# Patient Record
Sex: Male | Born: 1975 | Race: White | Hispanic: No | Smoking: Former smoker
Health system: Southern US, Community
[De-identification: ages and names within clinical notes are randomized; demographics above are authoritative.]

## PROBLEM LIST (undated history)

## (undated) DIAGNOSIS — I10 Essential (primary) hypertension: Secondary | ICD-10-CM

## (undated) DIAGNOSIS — N471 Phimosis: Secondary | ICD-10-CM

## (undated) DIAGNOSIS — H913 Deaf nonspeaking, not elsewhere classified: Secondary | ICD-10-CM

## (undated) DIAGNOSIS — I77810 Thoracic aortic ectasia: Secondary | ICD-10-CM

## (undated) DIAGNOSIS — K219 Gastro-esophageal reflux disease without esophagitis: Secondary | ICD-10-CM

## (undated) DIAGNOSIS — Z973 Presence of spectacles and contact lenses: Secondary | ICD-10-CM

## (undated) DIAGNOSIS — G4733 Obstructive sleep apnea (adult) (pediatric): Secondary | ICD-10-CM

## (undated) DIAGNOSIS — H9193 Unspecified hearing loss, bilateral: Secondary | ICD-10-CM

## (undated) DIAGNOSIS — E559 Vitamin D deficiency, unspecified: Secondary | ICD-10-CM

## (undated) HISTORY — PX: HAND CONTRACTURE RELEASE: SHX1724

---

## 2012-04-22 DIAGNOSIS — Z87828 Personal history of other (healed) physical injury and trauma: Secondary | ICD-10-CM

## 2012-04-22 HISTORY — PX: SKIN GRAFT FULL THICKNESS ARM: SUR1297

## 2012-04-22 HISTORY — DX: Personal history of other (healed) physical injury and trauma: Z87.828

## 2012-05-19 DIAGNOSIS — H913 Deaf nonspeaking, not elsewhere classified: Secondary | ICD-10-CM | POA: Insufficient documentation

## 2013-02-22 HISTORY — PX: CHOLECYSTECTOMY, LAPAROSCOPIC: SHX56

## 2016-02-23 HISTORY — PX: LIPOMA EXCISION: SHX5283

## 2016-12-02 DIAGNOSIS — E782 Mixed hyperlipidemia: Secondary | ICD-10-CM | POA: Insufficient documentation

## 2016-12-02 DIAGNOSIS — J449 Chronic obstructive pulmonary disease, unspecified: Secondary | ICD-10-CM | POA: Insufficient documentation

## 2016-12-02 DIAGNOSIS — F32A Depression, unspecified: Secondary | ICD-10-CM | POA: Insufficient documentation

## 2017-11-18 DIAGNOSIS — R112 Nausea with vomiting, unspecified: Secondary | ICD-10-CM | POA: Insufficient documentation

## 2018-01-04 DIAGNOSIS — R9430 Abnormal result of cardiovascular function study, unspecified: Secondary | ICD-10-CM | POA: Insufficient documentation

## 2018-01-04 DIAGNOSIS — G4730 Sleep apnea, unspecified: Secondary | ICD-10-CM | POA: Insufficient documentation

## 2018-01-04 DIAGNOSIS — I119 Hypertensive heart disease without heart failure: Secondary | ICD-10-CM | POA: Insufficient documentation

## 2018-01-04 DIAGNOSIS — R0683 Snoring: Secondary | ICD-10-CM | POA: Insufficient documentation

## 2018-01-04 DIAGNOSIS — R079 Chest pain, unspecified: Secondary | ICD-10-CM | POA: Insufficient documentation

## 2018-04-25 DIAGNOSIS — E66811 Obesity, class 1: Secondary | ICD-10-CM | POA: Insufficient documentation

## 2018-04-25 DIAGNOSIS — E6609 Other obesity due to excess calories: Secondary | ICD-10-CM | POA: Insufficient documentation

## 2019-07-18 ENCOUNTER — Emergency Department (HOSPITAL_COMMUNITY): Payer: No Typology Code available for payment source

## 2019-07-18 ENCOUNTER — Encounter (HOSPITAL_COMMUNITY): Payer: Self-pay | Admitting: Emergency Medicine

## 2019-07-18 ENCOUNTER — Other Ambulatory Visit: Payer: Self-pay

## 2019-07-18 ENCOUNTER — Emergency Department (HOSPITAL_COMMUNITY)
Admission: EM | Admit: 2019-07-18 | Discharge: 2019-07-19 | Disposition: A | Discharge status: Home / Self Care | Payer: No Typology Code available for payment source | Attending: Emergency Medicine | Admitting: Emergency Medicine

## 2019-07-18 DIAGNOSIS — F17228 Nicotine dependence, chewing tobacco, with other nicotine-induced disorders: Secondary | ICD-10-CM | POA: Diagnosis not present

## 2019-07-18 DIAGNOSIS — F17200 Nicotine dependence, unspecified, uncomplicated: Secondary | ICD-10-CM | POA: Diagnosis not present

## 2019-07-18 DIAGNOSIS — R0989 Other specified symptoms and signs involving the circulatory and respiratory systems: Secondary | ICD-10-CM | POA: Insufficient documentation

## 2019-07-18 DIAGNOSIS — H5789 Other specified disorders of eye and adnexa: Secondary | ICD-10-CM | POA: Diagnosis not present

## 2019-07-18 DIAGNOSIS — I10 Essential (primary) hypertension: Secondary | ICD-10-CM | POA: Diagnosis not present

## 2019-07-18 DIAGNOSIS — Z77098 Contact with and (suspected) exposure to other hazardous, chiefly nonmedicinal, chemicals: Secondary | ICD-10-CM

## 2019-07-18 HISTORY — DX: Essential (primary) hypertension: I10

## 2019-07-18 LAB — BASIC METABOLIC PANEL
Anion gap: 8 (ref 5–15)
BUN: 15 mg/dL (ref 6–20)
CO2: 24 mmol/L (ref 22–32)
Calcium: 9.1 mg/dL (ref 8.9–10.3)
Chloride: 109 mmol/L (ref 98–111)
Creatinine, Ser: 1.01 mg/dL (ref 0.61–1.24)
GFR calc Af Amer: >60 mL/min (ref >60)
GFR calc non Af Amer: >60 mL/min (ref >60)
Glucose, Bld: 89 mg/dL (ref 70–99)
Potassium: 3.9 mmol/L (ref 3.5–5.1)
Sodium: 141 mmol/L (ref 135–145)

## 2019-07-18 LAB — CBC
HCT: 44 % (ref 39.0–52.0)
Hemoglobin: 15.2 g/dL (ref 13.0–17.0)
MCH: 31.5 pg (ref 26.0–34.0)
MCHC: 34.5 g/dL (ref 30.0–36.0)
MCV: 91.3 fL (ref 80.0–100.0)
Platelets: 194 10*3/uL (ref 150–400)
RBC: 4.82 MIL/uL (ref 4.22–5.81)
RDW: 11.7 % (ref 11.5–15.5)
WBC: 6.7 10*3/uL (ref 4.0–10.5)
nRBC: 0 % (ref 0.0–0.2)

## 2019-07-18 NOTE — ED Triage Notes (Addendum)
Patient arrives to ED with complaints of being exposed to Peraside 15 at work which is peracetic acid and hydrogen peroxide. Patient states he had no contact with the chemical but was breathing the chemicals in for about 15 minutes. patient now complaints of his eyes burning, throat being sore, and chest burning. VSS. Patientes eyes washed with EMS and at work.

## 2019-07-18 NOTE — ED Notes (Signed)
Poison controlled called. Information on Peraside-15 obtained. Advised that irritation should not worsen over time. No need to flush with water at this time. Supplemental oxygen for comfort, but not necessary. Information relayed to [facility].

## 2019-07-19 NOTE — ED Provider Notes (Signed)
[facility] EMERGENCY DEPARTMENT Provider Note   CSN: [id number] Arrival date & time: 07/18/19  1840     History Chief Complaint  Patient presents with  . Chemical Exposure    Greg Velez is a 44 y.o. male with history significant for hypertension who presents for evaluation after chemical exposure.  Patient states he works at [employer].  Apparently someone had spilled Peraside-15.  On the floor.  Patient did not realize this.  He continued to work around the area for approximately 10 minutes.  Patient states he had itchy watery eyes, scratchy throat and burning sensation in his throat.  Was seen by EMS and had his eyes washed.  He denies getting any chemicals physically on him or in his eyes.  Patient with extended wait time greater than 8 hours in the waiting room.  On my initial evaluation he denies any complaints.  No headache, lightheadedness, dizziness, eye pain, vision changes, congestion, rhinorrhea, chest pain, shortness of breath, sensation of throat closing, cough, rashes or lesions.  Denies recent aggravating relieving factors.  History obtained from patient past medical records.  Sign language interpreter was used.  HPI     Past Medical History:  Diagnosis Date  . Hypertension     There are no problems to display for this patient.   History reviewed. No pertinent surgical history.     History reviewed. No pertinent family history.  Social History   Tobacco Use  . Smoking status: Current Every Day Smoker  . Smokeless tobacco: Current User  Substance Use Topics  . Alcohol use: Not on file  . Drug use: Not on file    Home Medications Prior to Admission medications   Not on File    Allergies    Patient has no known allergies.  Review of Systems   Review of Systems  Constitutional: Negative.   HENT: Negative for congestion, drooling, ear discharge, ear pain, facial swelling, postnasal drip, rhinorrhea, sinus pain, sore throat,  trouble swallowing and voice change.        Scratchy throat  Eyes: Positive for discharge.  Respiratory: Negative.   Cardiovascular: Negative.   Gastrointestinal: Negative.   Genitourinary: Negative.   Musculoskeletal: Negative.   Skin: Negative.   Neurological: Negative.   All other systems reviewed and are negative.   Physical Exam Updated Vital Signs BP 129/90   Pulse (!) 56   Temp 97.7 F (36.5 C) (Oral)   Resp 18   Ht 6\' 2"  (1.88 m)   Wt 127 kg   SpO2 100%   BMI 35.95 kg/m   Physical Exam Vitals and nursing note reviewed.  Constitutional:      General: He is not in acute distress.    Appearance: He is well-developed. He is not ill-appearing, toxic-appearing or diaphoretic.  HENT:     Head: Normocephalic and atraumatic.     Jaw: There is normal jaw occlusion.     Nose: Nose normal.     Comments: No rhinorrhea or congestion    Mouth/Throat:     Lips: Pink.     Mouth: Mucous membranes are moist.     Tongue: No lesions.     Palate: No mass and lesions.     Pharynx: Oropharynx is clear. Uvula midline.     Comments: Posterior oropharynx clear.  Mucous membranes moist.  No drooling, dysphagia or trismus.  No evidence of intraoral edema, lesions Eyes:     General: Lids are everted, no foreign bodies  appreciated. Vision grossly intact.     Extraocular Movements: Extraocular movements intact.     Conjunctiva/sclera: Conjunctivae normal.     Pupils: Pupils are equal, round, and reactive to light.     Visual Fields: Right eye visual fields normal and left eye visual fields normal.     Comments: Ph eyes bilateral WNL.  No fluorescein uptake on exam  Cardiovascular:     Rate and Rhythm: Normal rate and regular rhythm.     Pulses: Normal pulses.     Heart sounds: Normal heart sounds.  Pulmonary:     Effort: Pulmonary effort is normal. No respiratory distress.     Breath sounds: Normal breath sounds and air entry.  Abdominal:     General: There is no distension.      Palpations: Abdomen is soft.  Musculoskeletal:        General: Normal range of motion.     Cervical back: Normal range of motion and neck supple.  Skin:    General: Skin is warm and dry.     Capillary Refill: Capillary refill takes less than 2 seconds.     Comments: No rashes or lesions  Neurological:     General: No focal deficit present.     Mental Status: He is alert.     ED Results / Procedures / Treatments   Labs (all labs ordered are listed, but only abnormal results are displayed) Labs Reviewed  CBC  BASIC METABOLIC PANEL    EKG None  Radiology DG Chest 2 View  Result Date: 07/18/2019 CLINICAL DATA:  Chemical exposure. Exposed to peracetic acid and hydrogren peroxide for 15 minutes today. Burning sensation in chest. EXAM: CHEST - 2 VIEW COMPARISON:  None. FINDINGS: Lung volumes are low. The cardiomediastinal contours are normal for technique and inspiration. Patchy bibasilar opacities. Pulmonary vasculature is normal. No consolidation, pleural effusion, or pneumothorax. No acute osseous abnormalities are seen. IMPRESSION: Low lung volumes with patchy bibasilar opacities favoring atelectasis. Electronically Signed   By: Narda Rutherford M.D.   On: 07/18/2019 19:22    Procedures Procedures (including critical care time)  Medications Ordered in ED Medications - No data to display  ED Course  I have reviewed the triage vital signs and the nursing notes.  Pertinent labs & imaging results that were available during my care of the patient were reviewed by me and considered in my medical decision making (see chart for details).  44 year old male presents for evaluation of inhalation exposure while at work.  Patient with watery eyes, scratchy throat, burning sensation to throat.  Was seen by EMS at scene and had irrigation of his eyes.  Patient with extended wait time of greater than 8 hours in the waiting room.  On my initial evaluation patient denies any complaints.  His  heart and lungs are clear.  His abdomen is soft.  No evidence of rashes or lesions.  Eye exam without any fluorescein uptake, no ulcerations, pH eyes within normal limits.  No visual changes.  Posterior oropharynx clear.  No evidence of intraoral edema or lesions.  Poison control was contacted by nurses from triage.  Did not recommend any labs.  They stated patient did not need any flushing of his membranes her skin at that time.  Symptomatic management at home. Tolerating PO intake in ED without difficulty.  Labs and imaging were obtained from triage which I personally reviewed and interpreted.  No acute findings.  No evidence of pneumonitis or infectious process on chest  x-ray.  Patient likely with symptoms related to inhalation exposure.  Continues to be asymptomatic.  Discussed symptom control at home.  He can follow up outpatient with PCP for reevaluation or return here for any worsening symptoms.  The patient has been appropriately medically screened and/or stabilized in the ED. I have low suspicion for any other emergent medical condition which would require further screening, evaluation or treatment in the ED or require inpatient management.  Patient is hemodynamically stable and in no acute distress.  Patient able to ambulate in department prior to ED.  Evaluation does not show acute pathology that would require ongoing or additional emergent interventions while in the emergency department or further inpatient treatment.  I have discussed the diagnosis with the patient and answered all questions.  Pain is been managed while in the emergency department and patient has no further complaints prior to discharge.  Patient is comfortable with plan discussed in room and is stable for discharge at this time.  I have discussed strict return precautions for returning to the emergency department.  Patient was encouraged to follow-up with PCP/specialist refer to at discharge.   MDM Rules/Calculators/A&P                        Final Clinical Impression(s) / ED Diagnoses Final diagnoses:  None    Rx / DC Orders ED Discharge Orders    None       Rheanna Sergent A, PA-C 07/19/19 1443    Merryl Hacker, MD 07/19/19 [phone]

## 2019-07-19 NOTE — Discharge Instructions (Signed)
Use over-the-counter eyedrops for any irritation.  Make sure to drink plenty of fluids.  Return for any worsening symptoms.

## 2019-07-19 NOTE — ED Notes (Signed)
Pt given discharge instructions , no further questions at this time.

## 2020-03-11 ENCOUNTER — Emergency Department (HOSPITAL_COMMUNITY)
Admission: EM | Admit: 2020-03-11 | Discharge: 2020-03-11 | Disposition: A | Discharge status: Home / Self Care | Payer: BC Managed Care – PPO | Attending: Emergency Medicine | Admitting: Emergency Medicine

## 2020-03-11 ENCOUNTER — Emergency Department (HOSPITAL_COMMUNITY): Payer: BC Managed Care – PPO

## 2020-03-11 DIAGNOSIS — R079 Chest pain, unspecified: Secondary | ICD-10-CM

## 2020-03-11 DIAGNOSIS — R0602 Shortness of breath: Secondary | ICD-10-CM | POA: Insufficient documentation

## 2020-03-11 DIAGNOSIS — R0789 Other chest pain: Secondary | ICD-10-CM | POA: Diagnosis not present

## 2020-03-11 DIAGNOSIS — I10 Essential (primary) hypertension: Secondary | ICD-10-CM | POA: Insufficient documentation

## 2020-03-11 DIAGNOSIS — F17200 Nicotine dependence, unspecified, uncomplicated: Secondary | ICD-10-CM | POA: Insufficient documentation

## 2020-03-11 LAB — BASIC METABOLIC PANEL
Anion gap: 11 (ref 5–15)
BUN: 12 mg/dL (ref 6–20)
CO2: 21 mmol/L — ABNORMAL LOW (ref 22–32)
Calcium: 8.8 mg/dL — ABNORMAL LOW (ref 8.9–10.3)
Chloride: 106 mmol/L (ref 98–111)
Creatinine, Ser: 0.93 mg/dL (ref 0.61–1.24)
GFR, Estimated: >60 mL/min (ref >60)
Glucose, Bld: 82 mg/dL (ref 70–99)
Potassium: 4.1 mmol/L (ref 3.5–5.1)
Sodium: 138 mmol/L (ref 135–145)

## 2020-03-11 LAB — CBC
HCT: 44.9 % (ref 39.0–52.0)
Hemoglobin: 16.1 g/dL (ref 13.0–17.0)
MCH: 32.1 pg (ref 26.0–34.0)
MCHC: 35.9 g/dL (ref 30.0–36.0)
MCV: 89.4 fL (ref 80.0–100.0)
Platelets: 189 10*3/uL (ref 150–400)
RBC: 5.02 MIL/uL (ref 4.22–5.81)
RDW: 12 % (ref 11.5–15.5)
WBC: 7.1 10*3/uL (ref 4.0–10.5)
nRBC: 0 % (ref 0.0–0.2)

## 2020-03-11 LAB — BRAIN NATRIURETIC PEPTIDE: B Natriuretic Peptide: 7.4 pg/mL (ref 0.0–100.0)

## 2020-03-11 LAB — TROPONIN I (HIGH SENSITIVITY)
Troponin I (High Sensitivity): 2 ng/L (ref <18)
Troponin I (High Sensitivity): 3 ng/L (ref <18)

## 2020-03-11 MED ORDER — METHOCARBAMOL 500 MG PO TABS
500.0000 mg | ORAL_TABLET | Freq: Two times a day (BID) | ORAL | 0 refills | Status: DC
Start: 2020-03-11 — End: 2021-06-24

## 2020-03-11 MED ORDER — IBUPROFEN 600 MG PO TABS
600.0000 mg | ORAL_TABLET | Freq: Four times a day (QID) | ORAL | 0 refills | Status: DC | PRN
Start: 2020-03-11 — End: 2021-10-06

## 2020-03-11 MED ORDER — IOHEXOL 300 MG/ML  SOLN
75.0000 mL | Freq: Once | INTRAMUSCULAR | Status: AC | PRN
  Administered 2020-03-11: 75 mL via INTRAVENOUS

## 2020-03-11 NOTE — ED Triage Notes (Signed)
Pt is deaf 

## 2020-03-11 NOTE — Discharge Instructions (Signed)
Your work-up here in the ED today was reassuring.  Your history and physical exam is suggestive of nonspecific chest discomfort, likely musculoskeletal in nature.  I prescribed you ibuprofen 600 mg every 6 hours which I would like for you to take for inflammation and discomfort.  You were given a prescription for Robaxin which is a muscle relaxer.  You should not drive, work, consume alcohol, or operate machinery while taking this medication as it can make you very drowsy.    I also recommend activity modification, specifically no overhead arm movements or other activity that worsens your left-sided chest discomfort.  Suspect muscle strain.  I recommend light stretching exercises instead.  It is vitally important to get established with a primary care provider for ongoing evaluation and management.  I have also placed an ambulatory referral to cardiology given your risk factors and family history of premature cardiac disease.  Return to the ED or seek immediate medical attention should you experience any new or worsening symptoms.

## 2020-03-11 NOTE — ED Provider Notes (Signed)
[facility] EMERGENCY DEPARTMENT Provider Note   CSN: [id number] Arrival date & time: 03/11/20  1221     History No chief complaint on file.   Greg Velez is a 45 y.o. male with PMH of HTN, HLD, and OSA who presents the ED via EMS with complaints of chest pain.  On my examination, patient reports that at approximately 11:15 AM he was in the freezer reaching up high for a container with his right hand when he developed a sudden onset left-sided chest pain described as "stabbing".  He stopped what he was doing and went outside to the office, but then developed worsening left-sided chest pain as well as shortness of breath.  He took aspirin and nitro x3 without any relief in his chest discomfort.  He states that he is not actively experiencing chest pain unless he moves in certain ways.  For example, if he extends his left arm or leans forward in the bed he will develop recurrence of his left-sided chest pain described as "sharp".  He had been seen by a cardiologist previously, but has not yet established care here in the [place] area.  He does not yet have a primary care provider.  He is concerned because his father and brother have each had multiple heart attacks.  He endorses continued daily tobacco use.  He denies any recent illness or infection, fevers or chills, abdominal pain, nausea or vomiting, hemoptysis, history of clots or clotting disorder, orthopnea, leg swelling, or other symptoms.  HPI     Past Medical History:  Diagnosis Date  . Hypertension     There are no problems to display for this patient.   No past surgical history on file.     No family history on file.  Social History   Tobacco Use  . Smoking status: Current Every Day Smoker  . Smokeless tobacco: Current User    Home Medications Prior to Admission medications   Medication Sig Start Date End Date Taking? Authorizing Provider  ibuprofen (ADVIL) 600 MG tablet Take 1 tablet (600 mg  total) by mouth every 6 (six) hours as needed. 03/11/20  Yes Lorelee New, PA-C  methocarbamol (ROBAXIN) 500 MG tablet Take 1 tablet (500 mg total) by mouth 2 (two) times daily. 03/11/20  Yes Lorelee New, PA-C    Allergies    Patient has no known allergies.  Review of Systems   Review of Systems  All other systems reviewed and are negative.   Physical Exam Updated Vital Signs BP 131/90   Pulse (!) 59   Temp (!) 97.5 F (36.4 C) (Oral)   Resp 15   SpO2 99%   Physical Exam Vitals and nursing note reviewed. Exam conducted with a chaperone present.  Constitutional:      General: He is not in acute distress.    Appearance: Normal appearance. He is not ill-appearing.  HENT:     Head: Normocephalic and atraumatic.  Eyes:     General: No scleral icterus.    Conjunctiva/sclera: Conjunctivae normal.  Cardiovascular:     Rate and Rhythm: Normal rate and regular rhythm.     Pulses: Normal pulses.     Heart sounds: Normal heart sounds.  Pulmonary:     Effort: Pulmonary effort is normal. No respiratory distress.     Breath sounds: Normal breath sounds. No wheezing or rales.     Comments: CTA bilaterally.  No increased work of breathing.  No tachypnea.  99% on room  air.  No reproducible chest wall tenderness. Abdominal:     General: Abdomen is flat. There is no distension.     Palpations: Abdomen is soft.     Tenderness: There is no abdominal tenderness. There is no guarding.  Musculoskeletal:     Cervical back: Normal range of motion.     Right lower leg: No edema.     Left lower leg: No edema.     Comments: Reproducible left-sided chest pain described as pinpoint underneath left breast with left arm abduction and overhead reaching.    Skin:    General: Skin is dry.     Capillary Refill: Capillary refill takes less than 2 seconds.  Neurological:     Mental Status: He is alert.     GCS: GCS eye subscore is 4. GCS verbal subscore is 5. GCS motor subscore is 6.   Psychiatric:        Mood and Affect: Mood normal.        Behavior: Behavior normal.        Thought Content: Thought content normal.     ED Results / Procedures / Treatments   Labs (all labs ordered are listed, but only abnormal results are displayed) Labs Reviewed  BASIC METABOLIC PANEL - Abnormal; Notable for the following components:      Result Value   CO2 21 (*)    Calcium 8.8 (*)    All other components within normal limits  CBC  BRAIN NATRIURETIC PEPTIDE  TROPONIN I (HIGH SENSITIVITY)  TROPONIN I (HIGH SENSITIVITY)    EKG EKG Interpretation  Date/Time:  Tuesday March 11 2020 12:25:35 EST Ventricular Rate:  91 PR Interval:  136 QRS Duration: 84 QT Interval:  348 QTC Calculation: 428 R Axis:   143 Text Interpretation: Normal sinus rhythm Left posterior fascicular block Abnormal ECG No old tracing to compare Confirmed by Mancel BaleWentz, Elliott [phone]([id number]) on 03/11/2020 7:44:13 PM   Radiology DG Chest 2 View  Result Date: 03/11/2020 CLINICAL DATA:  Chest pain EXAM: CHEST - 2 VIEW COMPARISON:  07/18/2019 FINDINGS: The heart size and mediastinal contours are within normal limits. Mild, diffuse bilateral interstitial opacity, likely with some left basilar atelectasis. There is a rounded opacity of the suprahilar left lung, which appears somewhat increased compared to prior examination dated 07/18/2019 although projects below the left first rib end. The visualized skeletal structures are unremarkable. IMPRESSION: 1. Mild, diffuse bilateral interstitial opacity, likely with some left basilar atelectasis. Findings are most consistent with pulmonary edema. 2. There is a rounded opacity of the suprahilar left lung, which appears somewhat increased compared to prior examination dated 07/18/2019 although projects below the left first rib end. This is concerning for a pulmonary mass or nodule. Recommend CT to further evaluate, not necessarily on an emergent basis. Electronically Signed   By: Lauralyn PrimesAlex   Bibbey M.D.   On: 03/11/2020 13:08   CT Chest W Contrast  Result Date: 03/11/2020 CLINICAL DATA:  Abnormal chest x-ray chest pain EXAM: CT CHEST WITH CONTRAST TECHNIQUE: Multidetector CT imaging of the chest was performed during intravenous contrast administration. CONTRAST:  75mL OMNIPAQUE IOHEXOL 300 MG/ML  SOLN COMPARISON:  Chest x-ray 03/11/2020 FINDINGS: Cardiovascular: Nonaneurysmal aorta. Normal heart size. No pericardial effusion Mediastinum/Nodes: No enlarged mediastinal, hilar, or axillary lymph nodes. Thyroid gland, trachea, and esophagus demonstrate no significant findings. Lungs/Pleura: Lungs are clear. No pleural effusion or pneumothorax. Upper Abdomen: No acute abnormality. Musculoskeletal: No chest wall abnormality. No acute or significant osseous findings. IMPRESSION: Negative. No CT  evidence for acute intrathoracic abnormality. Negative for pulmonary nodule. Findings on recent chest radiography felt secondary to prominent left first rib articulation with the sternum. Electronically Signed   By: Jasmine Pang M.D.   On: 03/11/2020 21:54    Procedures Procedures (including critical care time)  Medications Ordered in ED Medications  iohexol (OMNIPAQUE) 300 MG/ML solution 75 mL (75 mLs Intravenous Contrast Given 03/11/20 2147)    ED Course  I have reviewed the triage vital signs and the nursing notes.  Pertinent labs & imaging results that were available during my care of the patient were reviewed by me and considered in my medical decision making (see chart for details).    MDM Rules/Calculators/A&P                          Maleak Duda was evaluated in Emergency Department on 03/11/2020 for the symptoms described in the history of present illness. He was evaluated in the context of the global COVID-19 pandemic, which necessitated consideration that the patient might be at risk for infection with the SARS-CoV-2 virus that causes COVID-19. Institutional protocols and algorithms  that pertain to the evaluation of patients at risk for COVID-19 are in a state of rapid change based on information released by regulatory bodies including the CDC and federal and state organizations. These policies and algorithms were followed during the patient's care in the ED.  I personally reviewed patient's medical chart and all notes from triage and staff during today's encounter. I have also ordered and reviewed all labs and imaging that I felt to be medically necessary in the evaluation of this patient's complaints and with consideration of their with their physical exam. If needed, translation services were available and utilized.   Patient's history and physical exam is atypical for ACS and more suggestive of musculoskeletal injury.  Plain films of chest are reviewed and concerning for pulmonary edema.  There is also a rounded opacity which appears somewhat increased when compared to prior imaging.  Concern for possible pulmonary mass or nodule and recommending CT for further delineation.  Given that patient does not have any outpatient follow-up at this time, will obtain CT while second troponin is pending.  CT is personally reviewed entirely negative.  No evidence of pulmonary nodule, pneumonia, or other acute cardiopulmonary disease.  On subsequent evaluation, patient still continues to deny any chest pain.  His left-sided chest pain was once again aggravated while getting his CT because he was asked to move his arm above his head.  There is a clear reproducible cause for his symptoms.  I discussed with patient that if his second troponin is negative for delta troponin, we will discharge him home with ibuprofen 600 mg every 6 hours, Robaxin, and topical Voltaren gel.  Encouraged light stretching exercises.  We will also place ambulatory referral to cardiology.  His laboratory work-up is reassuring thus far, but second troponin is still pending.  If no positive delta troponin, patient is  reasonable for discharge and outpatient follow-up.   Final Clinical Impression(s) / ED Diagnoses Final diagnoses:  Nonspecific chest pain    Rx / DC Orders ED Discharge Orders         Ordered    Ambulatory referral to Cardiology        03/11/20 2229    methocarbamol (ROBAXIN) 500 MG tablet  2 times daily        03/11/20 2230    ibuprofen (ADVIL) 600  MG tablet  Every 6 hours PRN        03/11/20 2230           Elvera Maria 03/12/20 1956    Mancel Bale, MD 03/13/20 [phone]

## 2020-03-11 NOTE — ED Triage Notes (Signed)
Pt here from work with c/o chest pain , pt received 3 nitro and 324mg  asa no change in pain , no n/v or sob

## 2020-03-25 NOTE — Progress Notes (Signed)
Cardiology Office Note:    Date:  03/27/2020   ID:  Greg Velez, DOB Jun 10, 1975, MRN [id number]  PCP:  Patient, No Pcp Per  [facility] Cardiologist:  Meriam Sprague, MD  [facility] Electrophysiologist:  None   Referring MD: Lorelee New, PA-C    History of Present Illness:    Greg Velez is a 45 y.o. male with a hx of deafness, HTN, HLD and OSA who was referred by Evelena Leyden, PA-C for further evaluation of chest pain.  Patient in [facility] ED on 03/11/20 with sudden onset severe left sided stabbing chest pain after he was reaching for a container in the freezer. He stopped what he was doing but the pain persisted and he took ASA and nitro without relief. Went to ER where vitals stable, trop negative x2, ECG without ischemic changes. CT chest without acute pathology. Pain deemed to likely be MSK in nature and patient was discharged home.  The patient states that he has occasional heart pounding and he starts to feel weak and he sits down and the sensation passes. No DOE, LE edema, orthopnea, lightheadedness, dizziness or fainting. No bleeding issues. He could walk a mile without needing to stop. He admits to eating fast food often and continues to smoke.  Patient was previously seen by Cardiology at [facility]. Records reviewed:  "Echocardiogram January 16, 2018 showed normal left ventricular size with systolic function in the low normal range. There is mildly increased left ventricular wall thickness and mildly dilated left atrium with grade 1 diastolic dysfunction. Right ventricular size and function was felt to be normal. The ascending aorta was mildly dilated.  Pharmacologic stress testing with nuclear imaging was obtained January 16, 2018. The result was normalwith no evidence of ischemia. Ejection fraction was calculated at 65%.Additionally, his C-reactive protein was normal."   Family history notable for MI in his father, uncle.  History obtained with  assistance of ASL interpreter.  Past Medical History:  Diagnosis Date  . Hypertension     History reviewed. No pertinent surgical history.  Current Medications: Current Meds  Medication Sig  . atorvastatin (LIPITOR) 10 MG tablet Take by mouth.  . benzonatate (TESSALON) 200 MG capsule Take by mouth.  . carvedilol (COREG) 6.25 MG tablet Take 1 tablet (6.25 mg total) by mouth 2 (two) times daily.  . cyclobenzaprine (FLEXERIL) 10 MG tablet Take by mouth.  Marland Kitchen ibuprofen (ADVIL) 600 MG tablet Take 1 tablet (600 mg total) by mouth every 6 (six) hours as needed.  Marland Kitchen losartan (COZAAR) 100 MG tablet Take 1 tablet by mouth daily.  . methocarbamol (ROBAXIN) 500 MG tablet Take 1 tablet (500 mg total) by mouth 2 (two) times daily.  Marland Kitchen omeprazole (PRILOSEC) 40 MG capsule   . [DISCONTINUED] lisinopril (ZESTRIL) 10 MG tablet Take by mouth.     Allergies:   Patient has no known allergies.   Social History   Socioeconomic History  . Marital status: Married    Spouse name: Not on file  . Number of children: Not on file  . Years of education: Not on file  . Highest education level: Not on file  Occupational History  . Not on file  Tobacco Use  . Smoking status: Current Every Day Smoker  . Smokeless tobacco: Current User  Substance and Sexual Activity  . Alcohol use: Not on file  . Drug use: Not on file  . Sexual activity: Not on file  Other Topics Concern  . Not on file  Social History Narrative  . Not on file   Social Determinants of Health   Financial Resource Strain: Not on file  Food Insecurity: Not on file  Transportation Needs: Not on file  Physical Activity: Not on file  Stress: Not on file  Social Connections: Not on file     Family History: The patient's family history is not on file.  ROS:   Please see the history of present illness.    Review of Systems  Constitutional: Negative for chills and fever.  HENT: Negative for nosebleeds.   Eyes: Negative for blurred  vision.  Respiratory: Negative for shortness of breath.   Cardiovascular: Positive for chest pain. Negative for palpitations, orthopnea, claudication, leg swelling and PND.  Gastrointestinal: Negative for nausea and vomiting.  Genitourinary: Negative for hematuria.  Musculoskeletal: Positive for joint pain. Negative for falls.  Neurological: Negative for dizziness and loss of consciousness.  Endo/Heme/Allergies: Negative for polydipsia.  Psychiatric/Behavioral: Negative for substance abuse.    EKGs/Labs/Other Studies Reviewed:    The following studies were reviewed today:  2019: Left Ventricle  Left ventricle size is normal.  The left ventricular systolic function is low normal. EF is visually  estimated at 50-55%  No regional wall motion abnormalities were noted.  No evidence of left ventricular mass or thrombus noted.  No evidence of ventricular septal defect.  Mildly increased left ventricle wall thickness.  Grade I Diastolic Dysfunction (Delayed relaxation).   Left Atrium  Mildly dilated left atrium.  No evidence of intracardiac shunting by color flow Doppler.  No evidence of atrial septal defect.   Right Atrium  Right atrial size is normal.   Right Ventricle  Normal right ventricular size and function.  A moderator band is seen in the right ventricle.   Mitral Valve  The mitral valve leaflets are mildly thickened.  No evidence of mitral valve stenosis.  Trace mitral regurgitation is present.   Tricuspid Valve  Tricuspid valve was not well visualized  There is trivial tricuspid regurgitation.  The right ventricular systolic pressure is within normal limits at 25  mmHg.   Aortic Valve  The aortic valve istrileaflet.  Aortic valve leaflets are somewhat thickened.  No hemodynamically significant valvular aortic stenosis.   Pulmonic Valve  The pulmonic valve was not well visualized.  Trace pulmonic regurgitation present.    Miscellaneous  There is mild dilation of the aortic root.  There is mild dilation of the ascending aorta.  No obvious dissection could be visualized.  The Pulmonary artery is within normal limits.   Pharmacologic stress testing with nuclear imaging was obtained January 16, 2018. The result was normalwith no evidence of ischemia. Ejection fraction was calculated at 65%.  EKG:  EKG 03/12/20: NSR with left posterior block  Recent Labs: 03/11/2020: B Natriuretic Peptide 7.4; BUN 12; Creatinine, Ser 0.93; Hemoglobin 16.1; Platelets 189; Potassium 4.1; Sodium 138  Recent Lipid Panel No results found for: CHOL, TRIG, HDL, CHOLHDL, VLDL, LDLCALC, LDLDIRECT    Physical Exam:    VS:  BP (!) 150/112 Comment: right arm 140/110  Pulse 75   Ht 6\' 2"  (1.88 m)   Wt (!) 313 lb (142 kg)   SpO2 95%   BMI 40.19 kg/m     Wt Readings from Last 3 Encounters:  03/27/20 (!) 313 lb (142 kg)  07/18/19 280 lb (127 kg)     GEN:  Well nourished, well developed in no acute distress. Deaf. HEENT: Normal NECK: No JVD; No carotid bruits CARDIAC:  RRR, no murmurs, rubs, gallops RESPIRATORY:  Clear to auscultation without rales, wheezing or rhonchi  ABDOMEN: Soft, non-tender, non-distended MUSCULOSKELETAL:  No edema; No deformity  SKIN: Warm and dry NEUROLOGIC:  Alert and oriented x 3 PSYCHIATRIC:  Normal affect   ASSESSMENT:    1. Atypical chest pain   2. Aortic dilatation (HCC)   3. Hypertension, unspecified type   4. Mixed hyperlipidemia   5. Tobacco abuse   6. Morbid obesity (HCC)    PLAN:    In order of problems listed above:  #Atypical Chest Pain: Likely MSK in nature as symptoms developed after reaching for something high in the fridge. Work-up including ECG, trops, CT chest reassuringly normal. Myoview 2019 norma. TTE in 2019 with EF 50-55% but no WMA.  -Continue to monitor  #HTN: Poorly controlled.  -Stop lisinopril and continue losartan 100mg  daily -Start coreg  6.25mg  BID given dilated aorta -Extensive counseling about Low Na diet and avoiding fast foods as likely a major contributor to his hypertesnion -Follow-up in HTN clinic  #Dilated ascending aorta: Noted on OSH TTE. No repeat in our system. -Repeat TTE -Blood pressure control as above  #HLD: -Continue atorvastatin 10mg  -Check lipids next week  #Tobacco Abuse: Extensive time spent talking to the patient about tobacco cessation as this will significantly decrease his risk of future CV events. He is amenable to try. -Continue to encourage tobacco cessation  #Morbid Obesity:  BMI 40.  -Discussed diet and exercise at length as detailed below  Exercise recommendations: Goal of exercising for at least 30 minutes a day, at least 5 times per week.  Please exercise to a moderate exertion.  This means that while exercising it is difficult to speak in full sentences, however you are not so short of breath that you feel you must stop, and not so comfortable that you can carry on a full conversation.  Exertion level should be approximately a 5/10, if 10 is the most exertion you can perform.  Diet recommendations: Recommend a heart healthy diet such as the Mediterranean diet.  This diet consists of plant based foods, healthy fats, lean meats, olive oil.  It suggests limiting the intake of simple carbohydrates such as white breads, pastries, and pastas.  It also limits the amount of red meat, wine, and dairy products such as cheese that one should consume on a daily basis.   Medication Adjustments/Labs and Tests Ordered: Current medicines are reviewed at length with the patient today.  Concerns regarding medicines are outlined above.  Orders Placed This Encounter  Procedures  . Lipid Profile  . Basic Metabolic Panel (BMET)  . AMB Referral to [facility] Pharm[facility]  . ECHOCARDIOGRAM COMPLETE   Meds ordered this encounter  Medications  . carvedilol (COREG) 6.25 MG tablet    Sig: Take 1 tablet (6.25  mg total) by mouth 2 (two) times daily.    Dispense:  180 tablet    Refill:  1    Patient Instructions   Medication Instructions:  Your physician has recommended you make the following change in your medication: Stop lisinopril. Start Carvedilol 6.25 mg by mouth twice daily  *If you need a refill on your cardiac medications before your next appointment, please call your pharmacy*   Lab Work: Your physician recommends that you return for lab work next week on day of appointment in hypertension clinic.  This will be fasting lab work--BMP and lipid profile  If you have labs (blood work) drawn today and your tests are completely  normal, you will receive your results only by: Marland Kitchen. MyChart Message (if you have MyChart) OR . A paper copy in the mail If you have any lab test that is abnormal or we need to change your treatment, we will call you to review the results.   Testing/Procedures: Your physician has requested that you have an echocardiogram. Echocardiography is a painless test that uses sound waves to create images of your heart. It provides your doctor with information about the size and shape of your heart and how well your heart's chambers and valves are working. This procedure takes approximately one hour. There are no restrictions for this procedure.     Follow-Up: [facility], you and your health needs are our priority.  As part of our continuing mission to provide you with exceptional heart care, we have created designated Provider Care Teams.  These Care Teams include your primary Cardiologist (physician) and Advanced Practice Providers (APPs -  Physician Assistants and Nurse Practitioners) who all work together to provide you with the care you need, when you need it.  We recommend signing up for the patient portal called "MyChart".  Sign up information is provided on this After Visit Summary.  MyChart is used to connect with patients for Virtual Visits (Telemedicine).   Patients are able to view lab/test results, encounter notes, upcoming appointments, etc.  Non-urgent messages can be sent to your provider as well.   To learn more about what you can do with MyChart, go to [email or web address].    Your next appointment:   3 month(s)  The format for your next appointment:   In Person  Provider:   You may see Meriam SpragueHeather E Lucky Trotta, MD or one of the following Advanced Practice Providers on your designated Care Team:    Tereso NewcomerScott Weaver, PA-C  Vin Monmouth BeachBhagat, [place] JerseyPA-C    Other Instructions You have been referred to hypertension clinic in our office.  Please schedule appointment for next week.     Steps to Quit Smoking Smoking tobacco is the leading cause of preventable death. It can affect almost every organ in the body. Smoking puts you and those around you at risk for developing many serious chronic diseases. Quitting smoking can be difficult, but it is one of the best things that you can do for your health. It is never too late to quit. How do I get ready to quit? When you decide to quit smoking, create a plan to help you succeed. Before you quit:  Pick a date to quit. Set a date within the next 2 weeks to give you time to prepare.  Write down the reasons why you are quitting. Keep this list in places where you will see it often.  Tell your family, friends, and co-workers that you are quitting. Support from your loved ones can make quitting easier.  Talk with your health care provider about your options for quitting smoking.  Find out what treatment options are covered by your health insurance.  Identify people, places, things, and activities that make you want to smoke (triggers). Avoid them. What first steps can I take to quit smoking?  Throw away all cigarettes at home, at work, and in your car.  Throw away smoking accessories, such as Set designerashtrays and lighters.  Clean your car. Make sure to empty the ashtray.  Clean your home, including curtains  and carpets. What strategies can I use to quit smoking? Talk with your health care provider about combining strategies, such as taking medicines  while you are also receiving in-person counseling. Using these two strategies together makes you more likely to succeed in quitting than if you used either strategy on its own.  If you are pregnant or breastfeeding, talk with your health care provider about finding counseling or other support strategies to quit smoking. Do not take medicine to help you quit smoking unless your health care provider tells you to do so. To quit smoking: Quit right away  Quit smoking completely, instead of gradually reducing how much you smoke over a period of time. Research shows that stopping smoking right away is more successful than gradually quitting.  Attend in-person counseling to help you build problem-solving skills. You are more likely to succeed in quitting if you attend counseling sessions regularly. Even short sessions of 10 minutes can be effective. Take medicine You may take medicines to help you quit smoking. Some medicines require a prescription and some you can purchase over-the-counter. Medicines may have nicotine in them to replace the nicotine in cigarettes. Medicines may:  Help to stop cravings.  Help to relieve withdrawal symptoms. Your health care provider may recommend:  Nicotine patches, gum, or lozenges.  Nicotine inhalers or sprays.  Non-nicotine medicine that is taken by mouth. Find resources Find resources and support systems that can help you to quit smoking and remain smoke-free after you quit. These resources are most helpful when you use them often. They include:  Online chats with a Veterinary surgeon.  Telephone quitlines.  Printed Materials engineer.  Support groups or group counseling.  Text messaging programs.  Mobile phone apps or applications. Use apps that can help you stick to your quit plan by providing reminders, tips,  and encouragement. There are many free apps for mobile devices as well as websites. Examples include Quit Guide from the [employer] and smokefree.gov   What things can I do to make it easier to quit?  Reach out to your family and friends for support and encouragement. Call telephone quitlines (1-800-QUIT-NOW), reach out to support groups, or work with a counselor for support.  Ask people who smoke to avoid smoking around you.  Avoid places that trigger you to smoke, such as bars, parties, or smoke-break areas at work.  Spend time with people who do not smoke.  Lessen the stress in your life. Stress can be a smoking trigger for some people. To lessen stress, try: ? Exercising regularly. ? Doing deep-breathing exercises. ? Doing yoga. ? Meditating. ? Performing a body scan. This involves closing your eyes, scanning your body from head to toe, and noticing which parts of your body are particularly tense. Try to relax the muscles in those areas.   How will I feel when I quit smoking? Day 1 to 3 weeks Within the first 24 hours of quitting smoking, you may start to feel withdrawal symptoms. These symptoms are usually most noticeable 2-3 days after quitting, but they usually do not last for more than 2-3 weeks. You may experience these symptoms:  Mood swings.  Restlessness, anxiety, or irritability.  Trouble concentrating.  Dizziness.  Strong cravings for sugary foods and nicotine.  Mild weight gain.  Constipation.  Nausea.  Coughing or a sore throat.  Changes in how the medicines that you take for unrelated issues work in your body.  Depression.  Trouble sleeping (insomnia). Week 3 and afterward After the first 2-3 weeks of quitting, you may start to notice more positive results, such as:  Improved sense of smell and taste.  Decreased coughing  and sore throat.  Slower heart rate.  Lower blood pressure.  Clearer skin.  The ability to breathe more easily.  Fewer sick  days. Quitting smoking can be very challenging. Do not get discouraged if you are not successful the first time. Some people need to make many attempts to quit before they achieve long-term success. Do your best to stick to your quit plan, and talk with your health care provider if you have any questions or concerns. Summary  Smoking tobacco is the leading cause of preventable death. Quitting smoking is one of the best things that you can do for your health.  When you decide to quit smoking, create a plan to help you succeed.  Quit smoking right away, not slowly over a period of time.  When you start quitting, seek help from your health care provider, family, or friends. This information is not intended to replace advice given to you by your health care provider. Make sure you discuss any questions you have with your health care provider. Document Revised: 11/03/2018 Document Reviewed: 04/29/2018 Elsevier Patient Education  2021 Elsevier Inc.    Mediterranean Diet A Mediterranean diet refers to food and lifestyle choices that are based on the traditions of countries located on the [place]. This way of eating has been shown to help prevent certain conditions and improve outcomes for people who have chronic diseases, like kidney disease and heart disease. What are tips for following this plan? Lifestyle  Cook and eat meals together with your family, when possible.  Drink enough fluid to keep your urine clear or pale yellow.  Be physically active every day. This includes: ? Aerobic exercise like running or swimming. ? Leisure activities like gardening, walking, or housework.  Get 7-8 hours of sleep each night.  If recommended by your health care provider, drink red wine in moderation. This means 1 glass a day for nonpregnant women and 2 glasses a day for men. A glass of wine equals 5 oz (150 mL). Reading food labels  Check the serving size of packaged foods. For foods such  as rice and pasta, the serving size refers to the amount of cooked product, not dry.  Check the total fat in packaged foods. Avoid foods that have saturated fat or trans fats.  Check the ingredients list for added sugars, such as corn syrup.   Shopping  At the grocery store, buy most of your food from the areas near the walls of the store. This includes: ? Fresh fruits and vegetables (produce). ? Grains, beans, nuts, and seeds. Some of these may be available in unpackaged forms or large amounts (in bulk). ? Fresh seafood. ? Poultry and eggs. ? Low-fat dairy products.  Buy whole ingredients instead of prepackaged foods.  Buy fresh fruits and vegetables in-season from local farmers markets.  Buy frozen fruits and vegetables in resealable bags.  If you do not have access to quality fresh seafood, buy precooked frozen shrimp or canned fish, such as tuna, salmon, or sardines.  Buy small amounts of raw or cooked vegetables, salads, or olives from the deli or salad bar at your store.  Stock your pantry so you always have certain foods on hand, such as olive oil, canned tuna, canned tomatoes, rice, pasta, and beans. Cooking  Cook foods with extra-virgin olive oil instead of using butter or other vegetable oils.  Have meat as a side dish, and have vegetables or grains as your main dish. This means having meat in small  portions or adding small amounts of meat to foods like pasta or stew.  Use beans or vegetables instead of meat in common dishes like chili or lasagna.  Experiment with different cooking methods. Try roasting or broiling vegetables instead of steaming or sauteing them.  Add frozen vegetables to soups, stews, pasta, or rice.  Add nuts or seeds for added healthy fat at each meal. You can add these to yogurt, salads, or vegetable dishes.  Marinate fish or vegetables using olive oil, lemon juice, garlic, and fresh herbs. Meal planning  Plan to eat 1 vegetarian meal one day  each week. Try to work up to 2 vegetarian meals, if possible.  Eat seafood 2 or more times a week.  Have healthy snacks readily available, such as: ? Vegetable sticks with hummus. ? [place] yogurt. ? Fruit and nut trail mix.  Eat balanced meals throughout the week. This includes: ? Fruit: 2-3 servings a day ? Vegetables: 4-5 servings a day ? Low-fat dairy: 2 servings a day ? Fish, poultry, or lean meat: 1 serving a day ? Beans and legumes: 2 or more servings a week ? Nuts and seeds: 1-2 servings a day ? Whole grains: 6-8 servings a day ? Extra-virgin olive oil: 3-4 servings a day  Limit red meat and sweets to only a few servings a month   What are my food choices?  Mediterranean diet ? Recommended  Grains: Whole-grain pasta. Brown rice. Bulgar wheat. Polenta. Couscous. Whole-wheat bread. Orpah Cobb.  Vegetables: Artichokes. Beets. Broccoli. Cabbage. Carrots. Eggplant. Green beans. Chard. Kale. Spinach. Onions. Leeks. Peas. Squash. Tomatoes. Peppers. Radishes.  Fruits: Apples. Apricots. Avocado. Berries. Bananas. Cherries. Dates. Figs. Grapes. Lemons. Melon. Oranges. Peaches. Plums. Pomegranate.  Meats and other protein foods: Beans. Almonds. Sunflower seeds. Pine nuts. Peanuts. Cod. Salmon. Scallops. Shrimp. Tuna. Tilapia. Clams. Oysters. Eggs.  Dairy: Low-fat milk. Cheese. Greek yogurt.  Beverages: Water. Red wine. Herbal tea.  Fats and oils: Extra virgin olive oil. Avocado oil. Grape seed oil.  Sweets and desserts: [place] yogurt with honey. Baked apples. Poached pears. Trail mix.  Seasoning and other foods: Basil. Cilantro. Coriander. Cumin. Mint. Parsley. Sage. Rosemary. Tarragon. Garlic. Oregano. Thyme. Pepper. Balsalmic vinegar. Tahini. Hummus. Tomato sauce. Olives. Mushrooms. ? Limit these  Grains: Prepackaged pasta or rice dishes. Prepackaged cereal with added sugar.  Vegetables: Deep fried potatoes (french fries).  Fruits: Fruit canned in syrup.  Meats and  other protein foods: Beef. Pork. Lamb. Poultry with skin. Hot dogs. Tomasa Blase.  Dairy: Ice cream. Sour cream. Whole milk.  Beverages: Juice. Sugar-sweetened soft drinks. Beer. Liquor and spirits.  Fats and oils: Butter. Canola oil. Vegetable oil. Beef fat (tallow). Lard.  Sweets and desserts: Cookies. Cakes. Pies. Candy.  Seasoning and other foods: Mayonnaise. Premade sauces and marinades. The items listed may not be a complete list. Talk with your dietitian about what dietary choices are right for you. Summary  The Mediterranean diet includes both food and lifestyle choices.  Eat a variety of fresh fruits and vegetables, beans, nuts, seeds, and whole grains.  Limit the amount of red meat and sweets that you eat.  Talk with your health care provider about whether it is safe for you to drink red wine in moderation. This means 1 glass a day for nonpregnant women and 2 glasses a day for men. A glass of wine equals 5 oz (150 mL). This information is not intended to replace advice given to you by your health care provider. Make sure you discuss any  questions you have with your health care provider. Document Revised: 10/09/2015 Document Reviewed: 10/02/2015 Elsevier Patient Education  2020 [employer].      Signed, Meriam Sprague, MD  03/27/2020 3:36 PM    [facility]

## 2020-03-27 ENCOUNTER — Encounter: Payer: Self-pay | Admitting: Cardiology

## 2020-03-27 ENCOUNTER — Other Ambulatory Visit: Payer: Self-pay

## 2020-03-27 ENCOUNTER — Ambulatory Visit: Payer: BC Managed Care – PPO | Admitting: Cardiology

## 2020-03-27 VITALS — BP 150/112 | HR 75 | Ht 74.0 in | Wt 313.0 lb

## 2020-03-27 DIAGNOSIS — Z720 Tobacco use: Secondary | ICD-10-CM

## 2020-03-27 DIAGNOSIS — I77819 Aortic ectasia, unspecified site: Secondary | ICD-10-CM | POA: Diagnosis not present

## 2020-03-27 DIAGNOSIS — E782 Mixed hyperlipidemia: Secondary | ICD-10-CM | POA: Diagnosis not present

## 2020-03-27 DIAGNOSIS — I10 Essential (primary) hypertension: Secondary | ICD-10-CM | POA: Diagnosis not present

## 2020-03-27 DIAGNOSIS — R0789 Other chest pain: Secondary | ICD-10-CM | POA: Diagnosis not present

## 2020-03-27 MED ORDER — CARVEDILOL 6.25 MG PO TABS
6.2500 mg | ORAL_TABLET | Freq: Two times a day (BID) | ORAL | 1 refills | Status: DC
Start: 2020-03-27 — End: 2020-06-13

## 2020-03-27 NOTE — Patient Instructions (Addendum)
Medication Instructions:  Your physician has recommended you make the following change in your medication: Stop lisinopril. Start Carvedilol 6.25 mg by mouth twice daily  *If you need a refill on your cardiac medications before your next appointment, please call your pharmacy*   Lab Work: Your physician recommends that you return for lab work next week on day of appointment in hypertension clinic.  This will be fasting lab work--BMP and lipid profile  If you have labs (blood work) drawn today and your tests are completely normal, you will receive your results only by: Marland Kitchen MyChart Message (if you have MyChart) OR . A paper copy in the mail If you have any lab test that is abnormal or we need to change your treatment, we will call you to review the results.   Testing/Procedures: Your physician has requested that you have an echocardiogram. Echocardiography is a painless test that uses sound waves to create images of your heart. It provides your doctor with information about the size and shape of your heart and how well your heart's chambers and valves are working. This procedure takes approximately one hour. There are no restrictions for this procedure.     Follow-Up: At [facility], you and your health needs are our priority.  As part of our continuing mission to provide you with exceptional heart care, we have created designated Provider Care Teams.  These Care Teams include your primary Cardiologist (physician) and Advanced Practice Providers (APPs -  Physician Assistants and Nurse Practitioners) who all work together to provide you with the care you need, when you need it.  We recommend signing up for the patient portal called "MyChart".  Sign up information is provided on this After Visit Summary.  MyChart is used to connect with patients for Virtual Visits (Telemedicine).  Patients are able to view lab/test results, encounter notes, upcoming appointments, etc.  Non-urgent messages can  be sent to your provider as well.   To learn more about what you can do with MyChart, go to [email or web address].    Your next appointment:   3 month(s)  The format for your next appointment:   In Person  Provider:   You may see Meriam Sprague, MD or one of the following Advanced Practice Providers on your designated Care Team:    Tereso Newcomer, PA-C  Vin McNeil, [place]    Other Instructions You have been referred to hypertension clinic in our office.  Please schedule appointment for next week.     Steps to Quit Smoking Smoking tobacco is the leading cause of preventable death. It can affect almost every organ in the body. Smoking puts you and those around you at risk for developing many serious chronic diseases. Quitting smoking can be difficult, but it is one of the best things that you can do for your health. It is never too late to quit. How do I get ready to quit? When you decide to quit smoking, create a plan to help you succeed. Before you quit:  Pick a date to quit. Set a date within the next 2 weeks to give you time to prepare.  Write down the reasons why you are quitting. Keep this list in places where you will see it often.  Tell your family, friends, and co-workers that you are quitting. Support from your loved ones can make quitting easier.  Talk with your health care provider about your options for quitting smoking.  Find out what treatment options are covered by your health  insurance.  Identify people, places, things, and activities that make you want to smoke (triggers). Avoid them. What first steps can I take to quit smoking?  Throw away all cigarettes at home, at work, and in your car.  Throw away smoking accessories, such as Set designer.  Clean your car. Make sure to empty the ashtray.  Clean your home, including curtains and carpets. What strategies can I use to quit smoking? Talk with your health care provider about combining  strategies, such as taking medicines while you are also receiving in-person counseling. Using these two strategies together makes you more likely to succeed in quitting than if you used either strategy on its own.  If you are pregnant or breastfeeding, talk with your health care provider about finding counseling or other support strategies to quit smoking. Do not take medicine to help you quit smoking unless your health care provider tells you to do so. To quit smoking: Quit right away  Quit smoking completely, instead of gradually reducing how much you smoke over a period of time. Research shows that stopping smoking right away is more successful than gradually quitting.  Attend in-person counseling to help you build problem-solving skills. You are more likely to succeed in quitting if you attend counseling sessions regularly. Even short sessions of 10 minutes can be effective. Take medicine You may take medicines to help you quit smoking. Some medicines require a prescription and some you can purchase over-the-counter. Medicines may have nicotine in them to replace the nicotine in cigarettes. Medicines may:  Help to stop cravings.  Help to relieve withdrawal symptoms. Your health care provider may recommend:  Nicotine patches, gum, or lozenges.  Nicotine inhalers or sprays.  Non-nicotine medicine that is taken by mouth. Find resources Find resources and support systems that can help you to quit smoking and remain smoke-free after you quit. These resources are most helpful when you use them often. They include:  Online chats with a Veterinary surgeon.  Telephone quitlines.  Printed Materials engineer.  Support groups or group counseling.  Text messaging programs.  Mobile phone apps or applications. Use apps that can help you stick to your quit plan by providing reminders, tips, and encouragement. There are many free apps for mobile devices as well as websites. Examples include Quit Guide  from the [employer] and smokefree.gov   What things can I do to make it easier to quit?  Reach out to your family and friends for support and encouragement. Call telephone quitlines (1-800-QUIT-NOW), reach out to support groups, or work with a counselor for support.  Ask people who smoke to avoid smoking around you.  Avoid places that trigger you to smoke, such as bars, parties, or smoke-break areas at work.  Spend time with people who do not smoke.  Lessen the stress in your life. Stress can be a smoking trigger for some people. To lessen stress, try: ? Exercising regularly. ? Doing deep-breathing exercises. ? Doing yoga. ? Meditating. ? Performing a body scan. This involves closing your eyes, scanning your body from head to toe, and noticing which parts of your body are particularly tense. Try to relax the muscles in those areas.   How will I feel when I quit smoking? Day 1 to 3 weeks Within the first 24 hours of quitting smoking, you may start to feel withdrawal symptoms. These symptoms are usually most noticeable 2-3 days after quitting, but they usually do not last for more than 2-3 weeks. You may experience these  symptoms:  Mood swings.  Restlessness, anxiety, or irritability.  Trouble concentrating.  Dizziness.  Strong cravings for sugary foods and nicotine.  Mild weight gain.  Constipation.  Nausea.  Coughing or a sore throat.  Changes in how the medicines that you take for unrelated issues work in your body.  Depression.  Trouble sleeping (insomnia). Week 3 and afterward After the first 2-3 weeks of quitting, you may start to notice more positive results, such as:  Improved sense of smell and taste.  Decreased coughing and sore throat.  Slower heart rate.  Lower blood pressure.  Clearer skin.  The ability to breathe more easily.  Fewer sick days. Quitting smoking can be very challenging. Do not get discouraged if you are not successful the first time.  Some people need to make many attempts to quit before they achieve long-term success. Do your best to stick to your quit plan, and talk with your health care provider if you have any questions or concerns. Summary  Smoking tobacco is the leading cause of preventable death. Quitting smoking is one of the best things that you can do for your health.  When you decide to quit smoking, create a plan to help you succeed.  Quit smoking right away, not slowly over a period of time.  When you start quitting, seek help from your health care provider, family, or friends. This information is not intended to replace advice given to you by your health care provider. Make sure you discuss any questions you have with your health care provider. Document Revised: 11/03/2018 Document Reviewed: 04/29/2018 Elsevier Patient Education  2021 Elsevier Inc.    Mediterranean Diet A Mediterranean diet refers to food and lifestyle choices that are based on the traditions of countries located on the [place]. This way of eating has been shown to help prevent certain conditions and improve outcomes for people who have chronic diseases, like kidney disease and heart disease. What are tips for following this plan? Lifestyle  Cook and eat meals together with your family, when possible.  Drink enough fluid to keep your urine clear or pale yellow.  Be physically active every day. This includes: ? Aerobic exercise like running or swimming. ? Leisure activities like gardening, walking, or housework.  Get 7-8 hours of sleep each night.  If recommended by your health care provider, drink red wine in moderation. This means 1 glass a day for nonpregnant women and 2 glasses a day for men. A glass of wine equals 5 oz (150 mL). Reading food labels  Check the serving size of packaged foods. For foods such as rice and pasta, the serving size refers to the amount of cooked product, not dry.  Check the total fat in  packaged foods. Avoid foods that have saturated fat or trans fats.  Check the ingredients list for added sugars, such as corn syrup.   Shopping  At the grocery store, buy most of your food from the areas near the walls of the store. This includes: ? Fresh fruits and vegetables (produce). ? Grains, beans, nuts, and seeds. Some of these may be available in unpackaged forms or large amounts (in bulk). ? Fresh seafood. ? Poultry and eggs. ? Low-fat dairy products.  Buy whole ingredients instead of prepackaged foods.  Buy fresh fruits and vegetables in-season from local farmers markets.  Buy frozen fruits and vegetables in resealable bags.  If you do not have access to quality fresh seafood, buy precooked frozen shrimp or canned fish,  such as tuna, salmon, or sardines.  Buy small amounts of raw or cooked vegetables, salads, or olives from the deli or salad bar at your store.  Stock your pantry so you always have certain foods on hand, such as olive oil, canned tuna, canned tomatoes, rice, pasta, and beans. Cooking  Cook foods with extra-virgin olive oil instead of using butter or other vegetable oils.  Have meat as a side dish, and have vegetables or grains as your main dish. This means having meat in small portions or adding small amounts of meat to foods like pasta or stew.  Use beans or vegetables instead of meat in common dishes like chili or lasagna.  Experiment with different cooking methods. Try roasting or broiling vegetables instead of steaming or sauteing them.  Add frozen vegetables to soups, stews, pasta, or rice.  Add nuts or seeds for added healthy fat at each meal. You can add these to yogurt, salads, or vegetable dishes.  Marinate fish or vegetables using olive oil, lemon juice, garlic, and fresh herbs. Meal planning  Plan to eat 1 vegetarian meal one day each week. Try to work up to 2 vegetarian meals, if possible.  Eat seafood 2 or more times a week.  Have  healthy snacks readily available, such as: ? Vegetable sticks with hummus. ? [place] yogurt. ? Fruit and nut trail mix.  Eat balanced meals throughout the week. This includes: ? Fruit: 2-3 servings a day ? Vegetables: 4-5 servings a day ? Low-fat dairy: 2 servings a day ? Fish, poultry, or lean meat: 1 serving a day ? Beans and legumes: 2 or more servings a week ? Nuts and seeds: 1-2 servings a day ? Whole grains: 6-8 servings a day ? Extra-virgin olive oil: 3-4 servings a day  Limit red meat and sweets to only a few servings a month   What are my food choices?  Mediterranean diet ? Recommended  Grains: Whole-grain pasta. Brown rice. Bulgar wheat. Polenta. Couscous. Whole-wheat bread. Orpah Cobb.  Vegetables: Artichokes. Beets. Broccoli. Cabbage. Carrots. Eggplant. Green beans. Chard. Kale. Spinach. Onions. Leeks. Peas. Squash. Tomatoes. Peppers. Radishes.  Fruits: Apples. Apricots. Avocado. Berries. Bananas. Cherries. Dates. Figs. Grapes. Lemons. Melon. Oranges. Peaches. Plums. Pomegranate.  Meats and other protein foods: Beans. Almonds. Sunflower seeds. Pine nuts. Peanuts. Cod. Salmon. Scallops. Shrimp. Tuna. Tilapia. Clams. Oysters. Eggs.  Dairy: Low-fat milk. Cheese. Greek yogurt.  Beverages: Water. Red wine. Herbal tea.  Fats and oils: Extra virgin olive oil. Avocado oil. Grape seed oil.  Sweets and desserts: [place] yogurt with honey. Baked apples. Poached pears. Trail mix.  Seasoning and other foods: Basil. Cilantro. Coriander. Cumin. Mint. Parsley. Sage. Rosemary. Tarragon. Garlic. Oregano. Thyme. Pepper. Balsalmic vinegar. Tahini. Hummus. Tomato sauce. Olives. Mushrooms. ? Limit these  Grains: Prepackaged pasta or rice dishes. Prepackaged cereal with added sugar.  Vegetables: Deep fried potatoes (french fries).  Fruits: Fruit canned in syrup.  Meats and other protein foods: Beef. Pork. Lamb. Poultry with skin. Hot dogs. Tomasa Blase.  Dairy: Ice cream. Sour cream.  Whole milk.  Beverages: Juice. Sugar-sweetened soft drinks. Beer. Liquor and spirits.  Fats and oils: Butter. Canola oil. Vegetable oil. Beef fat (tallow). Lard.  Sweets and desserts: Cookies. Cakes. Pies. Candy.  Seasoning and other foods: Mayonnaise. Premade sauces and marinades. The items listed may not be a complete list. Talk with your dietitian about what dietary choices are right for you. Summary  The Mediterranean diet includes both food and lifestyle choices.  Eat a variety  of fresh fruits and vegetables, beans, nuts, seeds, and whole grains.  Limit the amount of red meat and sweets that you eat.  Talk with your health care provider about whether it is safe for you to drink red wine in moderation. This means 1 glass a day for nonpregnant women and 2 glasses a day for men. A glass of wine equals 5 oz (150 mL). This information is not intended to replace advice given to you by your health care provider. Make sure you discuss any questions you have with your health care provider. Document Revised: 10/09/2015 Document Reviewed: 10/02/2015 Elsevier Patient Education  2020 [employer].

## 2020-04-04 ENCOUNTER — Other Ambulatory Visit: Payer: Self-pay

## 2020-04-04 ENCOUNTER — Other Ambulatory Visit: Payer: BC Managed Care – PPO | Admitting: *Deleted

## 2020-04-04 ENCOUNTER — Ambulatory Visit (INDEPENDENT_AMBULATORY_CARE_PROVIDER_SITE_OTHER): Payer: BC Managed Care – PPO | Admitting: Pharmacist

## 2020-04-04 DIAGNOSIS — I10 Essential (primary) hypertension: Secondary | ICD-10-CM | POA: Insufficient documentation

## 2020-04-04 DIAGNOSIS — I77819 Aortic ectasia, unspecified site: Secondary | ICD-10-CM

## 2020-04-04 LAB — LIPID PANEL
Chol/HDL Ratio: 3.2 ratio (ref 0.0–5.0)
Cholesterol, Total: 145 mg/dL (ref 100–199)
HDL: 45 mg/dL (ref >39)
LDL Chol Calc (NIH): 80 mg/dL (ref 0–99)
Triglycerides: 110 mg/dL (ref 0–149)
VLDL Cholesterol Cal: 20 mg/dL (ref 5–40)

## 2020-04-04 LAB — BASIC METABOLIC PANEL
BUN/Creatinine Ratio: 11 (ref 9–20)
BUN: 11 mg/dL (ref 6–24)
CO2: 26 mmol/L (ref 20–29)
Calcium: 9.2 mg/dL (ref 8.7–10.2)
Chloride: 105 mmol/L (ref 96–106)
Creatinine, Ser: 0.99 mg/dL (ref 0.76–1.27)
GFR calc Af Amer: 104 mL/min/{1.73_m2} (ref >59)
GFR calc non Af Amer: 90 mL/min/{1.73_m2} (ref >59)
Glucose: 91 mg/dL (ref 65–99)
Potassium: 4.4 mmol/L (ref 3.5–5.2)
Sodium: 144 mmol/L (ref 134–144)

## 2020-04-04 MED ORDER — AMLODIPINE BESYLATE 2.5 MG PO TABS
2.5000 mg | ORAL_TABLET | Freq: Every day | ORAL | 3 refills | Status: DC
Start: 2020-04-04 — End: 2020-04-18

## 2020-04-04 MED ORDER — OMEPRAZOLE 40 MG PO CPDR: 40.0000 mg | DELAYED_RELEASE_CAPSULE | Freq: Every day | ORAL | 0 refills | Status: DC

## 2020-04-04 NOTE — Progress Notes (Signed)
Patient ID: Greg Velez                 DOB: February 17, 1976                      MRN: [id number]     HPI: Greg Velez is a 45 y.o. male referred by Dr. Shari Prows to HTN clinic. PMH is significant for deafness, HTN, HLD and OSA. He was seen by Dr. Shari Prows on 2/3 for follow up for atypical chest pain. Blood pressure was 150/112 at that visit. 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 Lisinopril was stopped due to duplicate therapy with ACE and ARB.   Patient presents today for follow up. He I accompanied by a sign language interpreter. He thought he was supposed to stop taking losartan, so he has only been taking carvedilol. He denies any dizziness, lightheadedness, headache, blurred vision, SOB or swelling. He has not missed any doses. Does not do any exercise other than walking at work. He quite smoking after his appointment with Dr. Shari Prows. States it was easy for him to quite and he is getting his wife to quite too. He has not checked his blood pressure at home because he moved and does not know where his wrist BP cuff is. Sounds like he drinks alcohol on Thur-Sat 2-3 beers or whiskey per occasion. He has been paying a lot more attention to food labels and the amount of sodium.   Current HTN meds: carvedilol 6.25mg  twice a day, losartan 100mg  daily (not taking) Previously tried: ? BP goal: <130/80  Family History: No family history on file.  Social History: + tobacco (quite 1 week ago), beer or liquor on weekends (3 drinks)  Diet: eggs, bananas, oatmeal Lunch: wrap chicken, veggie, beef jerky Dinner: pork chop, hamburger, chicken, patty, hot dogs, wrap from gas station Drinks: OJ, apple jucie, 2-3 16 oz diet soda (mostly caffeine free), lemon flavored water- zero, decaf black coffee  Exercise: walks a lot at work, no other exercise  Home BP readings: none  Wt Readings from Last 3 Encounters:  03/27/20 (!) 313 lb (142 kg)   07/18/19 280 lb (127 kg)   BP Readings from Last 3 Encounters:  03/27/20 (!) 150/112  03/11/20 (!) 142/89  07/19/19 134/88   Pulse Readings from Last 3 Encounters:  03/27/20 75  03/11/20 (!) 59  07/19/19 (!) 56    Renal function: CrCl cannot be calculated (Patient's most recent lab result is older than the maximum 21 days allowed.).  Past Medical History:  Diagnosis Date  . Hypertension     Current Outpatient Medications on File Prior to Visit  Medication Sig Dispense Refill  . atorvastatin (LIPITOR) 10 MG tablet Take by mouth.    . benzonatate (TESSALON) 200 MG capsule Take by mouth.    . carvedilol (COREG) 6.25 MG tablet Take 1 tablet (6.25 mg total) by mouth 2 (two) times daily. 180 tablet 1  . cyclobenzaprine (FLEXERIL) 10 MG tablet Take by mouth.    07/21/19 ibuprofen (ADVIL) 600 MG tablet Take 1 tablet (600 mg total) by mouth every 6 (six) hours as needed. 30 tablet 0  . losartan (COZAAR) 100 MG tablet Take 1 tablet by mouth daily.    . methocarbamol (ROBAXIN) 500 MG tablet Take 1 tablet (500 mg total) by mouth 2 (two) times daily. 20 tablet 0  . omeprazole (PRILOSEC) 40 MG capsule  No current facility-administered medications on file prior to visit.    No Known Allergies  There were no vitals taken for this visit.   Assessment/Plan:  1. Hypertension - Blood pressure is above goal of <130/80.  Patient is not taking losartan currently. However, diastolic still considerably high. Will resume losartan 100mg  daily and add amlodipine 2.5mg  daily. Continue carvedilol 6.25mg  twice a day. Patient appears to be pretty knowledgeable about foods high in sodium. I did advise him that he should try to avoid items with >300mg  of sodium. Warned that items listed as "low sodium" do not necessarily mean that it doesn't have a lot of sodium in it. For example low sodium deli meats still have around 500mg  of sodium. I asked him to try to limit his alcohol consumption to 2 drinks per day.  Start going for walks outside where he increases his heart rate and breathing. I congratulated him on quitting smoking.  Will follow up in clinic in 2 weeks, same day he has an echo. Patient will start checking his blood pressure at home. He will ask his wife where she put the cuff. I have asked him to bring his log and blood pressure cuff with him to his next visit.   Thank you  , Pharm.D, BCPS, CPP [facility]  [address] [place], [address] [place]  Phone: [phone]; Fax: [phone]

## 2020-04-04 NOTE — Patient Instructions (Addendum)
Please resume taking losartan 100mg  daily Continue taking carvedilol 6.25mg  twice a day START taking amlodipine 2.5 mg daily  Check your blood pressure once a day at home. Write these readings down and bring them along with your blood pressure cuff to your next appointment.  You can contact me via mychart or have someone call me at [phone]  Hypertension "High blood pressure"  Hypertension is often called "The Silent Killer." It rarely causes symptoms until it is extremely  high or has done damage to other organs in the body. For this reason, you should have your  blood pressure checked regularly by your physician. We will check your blood pressure  every time you see a provider at one of our offices.   Your blood pressure reading consists of two numbers. Ideally, blood pressure should be  below 120/80. The first ("top") number is called the systolic pressure. It measures the  pressure in your arteries as your heart beats. The second ("bottom") number is called the diastolic pressure. It measures the pressure in your arteries as the heart relaxes between beats.  The benefits of getting your blood pressure under control are enormous. A 10-point  reduction in systolic blood pressure can reduce your risk of stroke by 27% and heart failure by 28%  Your blood pressure goal is <130/80  To check your pressure at home you will need to:  1. Sit up in a chair, with feet flat on the floor and back supported. Do not cross your ankles or legs. 2. Rest your left arm so that the cuff is about heart level. If the cuff goes on your upper arm,  then just relax the arm on the table, arm of the chair or your lap. If you have a wrist cuff, we  suggest relaxing your wrist against your chest (think of it as Pledging the Flag with the  wrong arm).  3. Place the cuff snugly around your arm, about 1 inch above the crook of your elbow. The  cords should be inside the groove of your elbow.  4. Sit  quietly, with the cuff in place, for about 5 minutes. After that 5 minutes press the power  button to start a reading. 5. Do not talk or move while the reading is taking place.  6. Record your readings on a sheet of paper. Although most cuffs have a memory, it is often  easier to see a pattern developing when the numbers are all in front of you.  7. You can repeat the reading after 1-3 minutes if it is recommended  Make sure your bladder is empty and you have not had caffeine or tobacco within the last 30 min  Always bring your blood pressure log with you to your appointments. If you have not brought your monitor in to be double checked for accuracy, please bring it to your next appointment.  You can find a list of validated (accurate) blood pressure cuffs at [email or web address].no  Healthy Diet  SALT  What is the big deal with sodium? Why the need to limit our intake? What is the connection to  blood pressure? And what is the difference between salt and sodium? Sodium attracts water. Think about it. When you eat an overly salty snack, you tend to become  thirsty and need more water. If you have too much sodium in your bloodstream, your body will  then pull water into the bloodstream as well, trying to correct the imbalance. When you have  more  volume in the bloodstream, your blood pressure goes up. Your heart must work harder to  pump the extra volume, and the increase in pressure can wear out blood vessels faster. You may  also notice bloating and weight gain. Hypertension is one of the leading risk factors for heart  disease. By limiting sodium intake throughout life you are helping decrease your risk of heart  disease later on.   Salt is made up of two minerals. Sodium and chloride. A teaspoon of salt contains about 40%  sodium and 60% chloride. One teaspoon of salt has 2,300 mg of sodium. While the current  USDA guideline states you should consume no more than 2,300 mg per day, both they  and the  American Heart Association recommend that you limit this to 1,500 mg to stay healthy. Sea salt  or Himalayan pink salt may have a slightly different taste, but they still have almost the same  percent of sodium per teaspoon. So feel free to use them instead of table salt, but don't use  more.  A common myth is that if you don't add salt to your food, you are following a low sodium diet.  However, 75% of the sodium consumed in the American diet is from processed foods, NOT the  salt shaker. We all know that chips and crackers are high in sodium, but there are many other  foods that we may not think of when limiting our sodium. Below are the "salty six" foods that  the American Heart Association wants you to be aware of. 1. Cold cuts - even the healthy sliced [place] can have over 1,000 mg of sodium per slice.   Compare different brands to see which has less sodium if you eat these regularly 2. Pizza - depending on your toppings, a slice of pizza can have up to 760 mg of   sodium. Put more veggies on it or just have a slice with a side salad and still enjoy. 3. Soup - yes even that old home remedy of chicken soup is loaded with sodium. Look   for low sodium versions. Or add a bunch of frozen veggies when heating it, this will   give you less sodium per serving 4. Breads - they may not taste salty, but a single slice of bread can have up to 230 mg of   sodium. Toast for breakfast, a sandwich at lunch and a dinner roll can quickly add up   to over 900 mg in just one day.  5. Chicken - some fresh or frozen chicken is injected with a sodium solution before it   reaches the store. A 4 oz serving should have no more than 100 mg sodium. And   watch for breaded frozen chicken nuggets, strips and tenders. They may seem like a   quick and easy "healthy" meal, but they have high amounts of sodium as well. 6. Burritos/tacos - just 2 teaspoons of taco seasoning can have over 400 mg sodium.   Try  making your own with equal parts of cumin, oregano, chili powder and garlic powder.   SUGAR  Sugar is a huge problem in the modern day diet. Sugar is a HUGE contributor to heart disease, diabetes, high triglyceride levels, fatty liver diease and obesity. Sugar is hidden in almost all packaged foods/beverages. It adds no nutritional benefit to your body and can cause major harm. Added sugar is extra sugar that is added beyond what is naturally found. The American Heart  Association recommends limiting added sugars to no more than 25g for women and 36 grams for men per day.  There are many names for sugar maltose, sucrose (names ending in "ose"), high fructose corn syrup, molasses, cane sugar, corn sweetener, raw sugar, syrup, honey or fruit juice concentrate.   One of the best ways to limit your added sugars is to stop drinking sweetened beverages such as soda, sweet tea, fruit juice or fancy coffee's. There is 65g of added sugars in one 20oz bottle of Coke!! That is equal to 6 donuts.   Pay attention and read all nutrition facts labels. Below is an examples of a nutrition facts label. The #1 is showing you the total sugars where the # 2 is showing you the added sugars. This one severing has almost the max amount of added sugars per day!  Watch out for items that say "low fat" or "no added sugar" as these products are typically very high in sugar. The food industry uses these terms to fool you into thinking they are healthy.  For more information on the dangers of sugar watch WHY Sugar is as Bad as Alcohol (Fructose, The Liver Toxin) on YouTube.    EXERCISE  Exercise can help lower your blood pressure ~5 points systolic (top #) and 8 points diastolic (bottom #)  Exercise is good. We've all heard that. In an ideal world, we would all have time and resources to  get plenty of it. When you are active your heart pumps more efficiently and you will feel better.  Multiple studies show that even  walking regularly has benefits that include living a longer life.  The American Heart Association recommends 90-150 minutes per week of exercise (30 minutes  per day most days of the week). You can do this in any increment you wish. Nine or more  10-minute walks count. So does an hour-long exercise class. Break the time apart into what will  work in your life. Some of the best things you can do include walking briskly, jogging, cycling or  swimming laps. Not everyone is ready to "exercise." Sometimes we need to start with just getting active. Here  are some easy ways to be more active throughout the day: Marland Kitchen Take the stairs instead of the elevator . Go for a 10-15 minute walk during your lunch break (find a friend to make it more enjoyable) . When shopping, park at the back of the parking lot . If you take public transportation, get off one stop early and walk the extra distance . Pace around while making phone calls (most of [place] are not attached to phone cords any longer!) Check with your doctor if you aren't sure what your limitations may be. Always remember to drink plenty of water when doing any type of exercise. Don't feel like a failure if you're not getting the 90-150 minutes per week. If you started by being  a couch potato, then just a 10-minute walk each day is a huge improvement. Start with little  victories and work your way up.   Healthy Eating Tips  When looking to improve your eating habits, whether to lose weight, lower blood pressure or just be healthier, it helps to know what a serving size is.   Grains 1 slice of bread,  bagel,  cup pasta or rice  Vegetables 1 cup fresh or raw vegetables,  cup cooked or canned Fruits 1 piece of medium sized fruit,  cup canned,   Meats/Proteins  cup dried       1 oz meat, 1 egg,  cup cooked beans, nuts or seeds  Dairy        Fats Individual yogurt container, 1 cup (8oz)    1 teaspoon margarine/butter or vegetable  milk or milk  alternative, 1 slice of cheese          oil; 1 tablespoon mayonnaise or salad dressing                  Plan ahead: make a menu of the meals for a week then create a grocery list to go with  that menu. Consider meals that easily stretch into a night of leftovers, such as stews or  casseroles. Or consider making two of your favorite meal and put one in the freezer for  another night. Try a night or two each week that is "meatless" or "no cook" such as salads.  When you get home from the grocery store wash and prepare your vegetables and fruits.  Then when you need them they are ready to go.  Tips for going to the grocery store: . Buy store or generic brands . Check the weekly ad from your store on-line or in their in-store flyer . Look at the unit price on the shelf tag to compare/contrast the costs of different items . Buy fruits/vegetables in season . Carrots, bananas and apples are low-cost, naturally healthy items . If meats or frozen vegetables are on sale, buy some extras and put in your freezer . Limit buying prepared or "ready to eat" items, even if they are pre-made salads or fruit snacks . Do not shop when you're hungry . Foods at eye level tend to be more expensive. Look on the high and low shelves for deals. . Consider shopping at the farmer's market for fresh foods in season. . Choose canned tuna or salmon instead of fresh . Avoid the cookie and chip aisles (these are expensive, high in calories and low in  nutritional value) Healthy food preparations: . If you can't get lean hamburger, be sure to drain the fat when cooking . Steam, saut (in olive oil), grill or bake foods . Experiment with different seasonings to avoid adding salt to your foods. Kosher salt, sea salt and Himalayan salt are all still salt and should be avoided         Resources: American Heart Association - MartiniMobile.it Go to the Healthy Living tab to get more information American Diabetes  Association - www.diabetes.org You don't have to be diabetic - check out the Food and Fitness tab  DASH diet - PopSteam.is Health topics - or just search on their home page for DASH Quit for Life - www.cancer.org Follow the Stay Healthy tab to learn more about smoking cessatio

## 2020-04-07 ENCOUNTER — Telehealth: Payer: Self-pay | Admitting: Pharmacist

## 2020-04-07 NOTE — Telephone Encounter (Signed)
I received a VM message via the interrupter line on Friday 2/11. I tried calling line back, but no answer. Called back today. Interrupter line picked up, but patient did not pick up. Left message for patient to call back.  Phone # given was [phone]

## 2020-04-10 DIAGNOSIS — K219 Gastro-esophageal reflux disease without esophagitis: Secondary | ICD-10-CM | POA: Insufficient documentation

## 2020-04-13 DIAGNOSIS — H913 Deaf nonspeaking, not elsewhere classified: Secondary | ICD-10-CM | POA: Insufficient documentation

## 2020-04-18 ENCOUNTER — Other Ambulatory Visit: Payer: Self-pay

## 2020-04-18 ENCOUNTER — Ambulatory Visit (INDEPENDENT_AMBULATORY_CARE_PROVIDER_SITE_OTHER): Payer: BC Managed Care – PPO | Admitting: Cardiovascular Disease

## 2020-04-18 ENCOUNTER — Ambulatory Visit (HOSPITAL_COMMUNITY): Payer: BC Managed Care – PPO | Attending: Internal Medicine

## 2020-04-18 VITALS — BP 148/100 | HR 60

## 2020-04-18 DIAGNOSIS — I10 Essential (primary) hypertension: Secondary | ICD-10-CM

## 2020-04-18 DIAGNOSIS — I77819 Aortic ectasia, unspecified site: Secondary | ICD-10-CM | POA: Diagnosis not present

## 2020-04-18 LAB — ECHOCARDIOGRAM COMPLETE
Area-P 1/2: 3.2 cm2
S' Lateral: 3.3 cm

## 2020-04-18 MED ORDER — PERFLUTREN LIPID MICROSPHERE
1.0000 mL | INTRAVENOUS | Status: AC | PRN
  Administered 2020-04-18: 1 mL via INTRAVENOUS

## 2020-04-18 MED ORDER — AMLODIPINE BESYLATE 5 MG PO TABS
5.0000 mg | ORAL_TABLET | Freq: Every day | ORAL | 3 refills | Status: DC
Start: 2020-04-18 — End: 2020-06-13

## 2020-04-18 NOTE — Patient Instructions (Addendum)
It was nice to see you again!  Please increase your amlodipine to 5mg  daily  Try to pick up an upper arm wrist cuff. Usually recommend a OMRON Gold series  Keep checking blood pressure at home  Start going for walks in the AM. Start with 10 min and increase as able

## 2020-04-18 NOTE — Progress Notes (Signed)
Patient ID: Greg Velez                 DOB: 1976/02/17                      MRN: [id number]     HPI: Greg Velez is a 45 y.o. male referred by Dr. Shari Prows to HTN clinic. PMH is significant for deafness, HTN, HLD and OSA. He was seen by Dr. Shari Prows on 2/3 for follow up for atypical chest pain. Blood pressure was 150/112 at that visit. 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 Lisinopril was stopped due to duplicate therapy with ACE and ARB.   At last visit in HTN clinic on 04/04/20 patient was asked to resume taking losartan 100mg  and was started on amlodipine 2.5mg  daily. BP in office was 140/102. He was educated about sodium and encouraged to keep his alcohol consumption to 2 drinks per day. He was not checking his BP bc he did not know where his cuff was.  Patient saw PCP on 2/17. His BP was 136/90 at that appointment  Patient presents today for follow up. He is accompanied by an interrupter. He brings list of blood pressures that he has been checking at home. They vary greatly. He also brought his medications and his wrist cuff. When wrist cuff was used across the chest its reading was much higher than clinic, but when used resting on desk it was within 2 points for systolic but 10 points lower for diastolic. Clinic cuff 148/100 vs Wrist cuff: 150/90. Has not really drank alcohol these last few weeks. Is not smoking cigarettes but is still using smokeless tobacco (for the past 30 years). He denies dizziness, lightheadedness, headache, blurred vision, SOB or swelling. Has not missed a dose of his medications. Uses a med box that his wife helps him set up. Patient admits he is trying to read food labels and decrease his sodium intake.  Current HTN meds: carvedilol 6.25mg  twice a day, losartan 100mg  daily, amlodipine 2.5mg  daily BP goal: <130/80  Family History: No family history on file.  Social History: + tobacco (quite 1 week ago), beer or  liquor on weekends (3 drinks), chewing tobacco  Diet: eggs, bananas, oatmeal Lunch: wrap chicken, veggie, beef jerky Dinner: pork chop, hamburger, chicken, 3/17 patty, hot dogs, wrap from gas station Drinks: OJ, apple jucie, 2-3 16 oz diet soda (mostly caffeine free), lemon flavored water- zero, decaf black coffee  Exercise: walks a lot at work, no other exercise  Home BP readings: 96/77, 126/89, 160/100, 171/97, 163/93, 141/88, 130/82, 130/94, 128/90,   Wt Readings from Last 3 Encounters:  03/27/20 (!) 313 lb (142 kg)  07/18/19 280 lb (127 kg)   BP Readings from Last 3 Encounters:  04/04/20 (!) 140/102  03/27/20 (!) 150/112  03/11/20 (!) 142/89   Pulse Readings from Last 3 Encounters:  04/04/20 71  03/27/20 75  03/11/20 (!) 59    Renal function: CrCl cannot be calculated (Unknown ideal weight.).  Past Medical History:  Diagnosis Date  . Hypertension     Current Outpatient Medications on File Prior to Visit  Medication Sig Dispense Refill  . amLODipine (NORVASC) 2.5 MG tablet Take 1 tablet (2.5 mg total) by mouth daily. 90 tablet 3  . atorvastatin (LIPITOR) 10 MG tablet Take by mouth.    . benzonatate (TESSALON) 200 MG capsule Take by mouth.    . carvedilol (COREG) 6.25  MG tablet Take 1 tablet (6.25 mg total) by mouth 2 (two) times daily. 180 tablet 1  . cyclobenzaprine (FLEXERIL) 10 MG tablet Take by mouth.    Marland Kitchen ibuprofen (ADVIL) 600 MG tablet Take 1 tablet (600 mg total) by mouth every 6 (six) hours as needed. 30 tablet 0  . losartan (COZAAR) 100 MG tablet Take 1 tablet by mouth daily.    . methocarbamol (ROBAXIN) 500 MG tablet Take 1 tablet (500 mg total) by mouth 2 (two) times daily. 20 tablet 0  . omeprazole (PRILOSEC) 40 MG capsule Take 1 capsule (40 mg total) by mouth daily. 90 capsule 0   No current facility-administered medications on file prior to visit.    No Known Allergies  There were no vitals taken for this visit.   Assessment/Plan:  1.  Hypertension - Blood pressure is above goal of <130/80. Will increase amlodipine to 5mg  daily. Continue carvedilol 6.25mg  twice a day and losartan 100mg  daily. Encouraged patient to go for a walk at home before work. He doesn't want to take a lot of medications so I advised him if he starts walking it could decrease the amount of medications he needs to get his blood pressure under control. We also discussed the poor health outcomes of smokeless tobacco. Home blood pressure cuff does not seem very accurate. I've advised patient to try to pick up an upper arm cuff. Follow up in clinic in 3 weeks.  Thank you  , Pharm.D, BCPS, CPP [facility]  [address] [place], [address] [place]  Phone: [phone]; Fax: [phone]

## 2020-04-24 ENCOUNTER — Encounter: Payer: Self-pay | Admitting: *Deleted

## 2020-05-09 ENCOUNTER — Ambulatory Visit (INDEPENDENT_AMBULATORY_CARE_PROVIDER_SITE_OTHER): Payer: BC Managed Care – PPO | Admitting: Student-PharmD

## 2020-05-09 ENCOUNTER — Other Ambulatory Visit: Payer: Self-pay

## 2020-05-09 VITALS — BP 132/80 | HR 68

## 2020-05-09 DIAGNOSIS — I10 Essential (primary) hypertension: Secondary | ICD-10-CM

## 2020-05-09 MED ORDER — OMEPRAZOLE 40 MG PO CPDR: 40.0000 mg | DELAYED_RELEASE_CAPSULE | Freq: Every day | ORAL | 3 refills | Status: AC

## 2020-05-09 NOTE — Progress Notes (Signed)
Patient ID: Greg Velez                 DOB: [birth year]                      MRN: [id number]     HPI: Greg Velez is a 45 y.o. male referred by Dr. Shari Prows to HTN clinic. PMH is significant for deafness,HTN, HLD and OSA. He was seen by Dr. Shari Prows on 2/3 for follow up for atypical chest pain. Blood pressure was 150/112 at that visit. 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 Lisinopril was stopped due to duplicate therapy with ACE and ARB.   At visit in HTN clinic on 04/04/20 patient was asked to resume taking losartan 100mg  (he got mixed up and had stopped this) and was started on amlodipine 2.5mg  daily. BP in office was 140/102. He was educated about sodium and encouraged to keep his alcohol consumption to 2 drinks per day. He was not checking his BP bc he did not know where his cuff was.  Patient saw PCP on 2/17. His BP was 136/90 at that appointment, no changes made to BP medications.   Last seen by pharmacy clinic on 2/25. Home BP readings varied greatly. He brought his medications and his wrist cuff. When wrist cuff was used across the chest its reading was much higher than clinic, but when used resting on desk it was within 2 points for systolic but 10 points lower for diastolic. Clinic cuff 148/100 vs Wrist cuff: 150/90. Reported trying to read food labels and decrease his sodium intake. Amlodipine was increased to 5 mg daily. Encouraged to purchase an upper arm BP cuff and start going for walks in the AM. Patient expressed desire to not take a lot of medications.   Patient presents today for follow up. He is accompanied by a sign language interpreter. He reports feeling good and having more energy lately. He denies any dizziness, lightheadedness, headache, blurred vision, SOB or swelling. He has not missed any doses, reports that using pill packs has been very helpful to organize and remember to take his medications. He is tolerating the  increased dose of his amlodipine well. He continues to remain off smoking cigarettes but is still using smokeless tobacco, once in the morning and once in the evening. He bought the current can he is using last Wednesday and has still not run out. He knows he needs to decrease use of this for his health but says it is hard to quit this since he also recently quit smoking and he has been doing it for a very long time. He continues to reduce his salt intake and does not add salt to foods. He does not drink as much alcohol lately. His exercise is unchanged from last visit, mostly just walking at his job and around the house. He is still using the same wrist cuff as his last visit (rests arm across chest) and brings list of all his readings. He checks BP in the morning before taking his medications. There is much less variability in these readings than last visit. Range today is 100/67-133/88 with most readings in 110s/70s-80s. Had multiple readings at last visit with BP 160s-170s/90s.   Current HTN meds:  Carvedilol 6.25 mg twice a day (8am, 11pm) Losartan 100 mg daily (8am) Amlodipine 5 mg daily (8am)  BP goal: <130/80  Family History: No family history on file.  Social  History: + tobacco (quit 03/2020), beer or liquor on weekends (3 drinks)  Diet:  Breakfast: eggs, bananas, oatmeal Lunch: wrap chicken, veggie, beef jerky Dinner: pork chop, hamburger, chicken, [place] patty, hot dogs, wrap from gas station Drinks: OJ, apple juice, 2-3 16 oz diet soda (mostly caffeine free), lemon flavored water- zero, decaf black coffee  Exercise: Walks a lot at work, no other exercise  Home BP readings: 120/74, 110/83, 118/82, 119/79, 133/88, 112/83, 131/87, 107/77, 100/67, 120/82, 123/90, 111/77 Uses wrist cuff, holds arm across chest Checks before taking meds  Wt Readings from Last 3 Encounters:  03/27/20 (!) 313 lb (142 kg)  07/18/19 280 lb (127 kg)   BP Readings from Last 3 Encounters:  04/18/20 (!)  148/100  04/04/20 (!) 140/102  03/27/20 (!) 150/112   Pulse Readings from Last 3 Encounters:  04/18/20 60  04/04/20 71  03/27/20 75    Renal function: CrCl cannot be calculated (Patient's most recent lab result is older than the maximum 21 days allowed.).  Past Medical History:  Diagnosis Date  . Hypertension     Current Outpatient Medications on File Prior to Visit  Medication Sig Dispense Refill  . amLODipine (NORVASC) 5 MG tablet Take 1 tablet (5 mg total) by mouth daily. 180 tablet 3  . atorvastatin (LIPITOR) 10 MG tablet Take by mouth.    . benzonatate (TESSALON) 200 MG capsule Take by mouth.    . Calcium 600-200 MG-UNIT tablet Take 1 tablet by mouth daily.    . carvedilol (COREG) 6.25 MG tablet Take 1 tablet (6.25 mg total) by mouth 2 (two) times daily. 180 tablet 1  . cyclobenzaprine (FLEXERIL) 10 MG tablet Take by mouth. (Patient not taking: Reported on 04/18/2020)    . ibuprofen (ADVIL) 600 MG tablet Take 1 tablet (600 mg total) by mouth every 6 (six) hours as needed. (Patient not taking: Reported on 04/18/2020) 30 tablet 0  . losartan (COZAAR) 100 MG tablet Take 1 tablet by mouth daily.    . methocarbamol (ROBAXIN) 500 MG tablet Take 1 tablet (500 mg total) by mouth 2 (two) times daily. 20 tablet 0  . omeprazole (PRILOSEC) 40 MG capsule Take 1 capsule (40 mg total) by mouth daily. 90 capsule 0   No current facility-administered medications on file prior to visit.    No Known Allergies  There were no vitals taken for this visit.   Assessment/Plan:  1. Hypertension - Blood pressure is slightly above goal of <130/80 at 132/80, but is improved from last visit after increasing amlodipine. There is much less variability in home readings, with most readings at goal. Continue current medications: amlodipine 5 mg daily, carvedilol 6.25 mg twice a day, losartan 100 mg daily. Counseled patient to start checking BP 1-2 hours after taking his morning medications. Encouraged him to  increase activity like going for a walk. Counseled patient to work on decreasing use of smokeless tobacco and continuing to limit salt intake. Follow up visit in 1 month to ensure BP at goal. If additional BP lowering is needed at that time, could consider increasing amlodipine to 10 mg daily or switching losartan to a different ARB such as valsartan or irbesartan.   Pervis Hocking, PharmD PGY1 Pharmacy Resident 05/09/2020 2:56 PM

## 2020-05-09 NOTE — Patient Instructions (Addendum)
It was nice to see you today!  Your goal blood pressure is less than 130/80 mmHg. In clinic, your blood pressure was 132/80 mmHg.  Medication Changes: Continue current medications: amlodipine 5 mg daily, carvedilol 6.25 mg twice daily, losartan 100 mg daily  Monitor blood pressure at home daily and keep a log (on your phone or piece of paper) to bring with you to your next visit. Write down date, time, blood pressure and pulse.   Try to check your blood pressure about 1-2 hours AFTER taking your medications.   Keep up the good work with diet and exercise. Aim for a diet full of vegetables, fruit and lean meats (chicken, [place], fish). Try to limit salt intake by eating fresh or frozen vegetables (instead of canned), rinse canned vegetables prior to cooking and do not add any additional salt to meals. Try to increase the amount of walking you are able to do, and work on decreasing your smokeless tobacco use.   Please give [place] a call at [phone] with any questions or concerns.

## 2020-06-13 ENCOUNTER — Ambulatory Visit (INDEPENDENT_AMBULATORY_CARE_PROVIDER_SITE_OTHER): Payer: BC Managed Care – PPO | Admitting: Pharmacist

## 2020-06-13 ENCOUNTER — Other Ambulatory Visit: Payer: Self-pay

## 2020-06-13 VITALS — BP 130/80 | HR 66

## 2020-06-13 DIAGNOSIS — I10 Essential (primary) hypertension: Secondary | ICD-10-CM

## 2020-06-13 MED ORDER — AMLODIPINE BESYLATE 5 MG PO TABS: 5.0000 mg | ORAL_TABLET | Freq: Every day | ORAL | 1 refills | Status: DC

## 2020-06-13 MED ORDER — LOSARTAN POTASSIUM 100 MG PO TABS: 1.0000 | ORAL_TABLET | Freq: Every day | ORAL | 1 refills | Status: DC

## 2020-06-13 MED ORDER — CARVEDILOL 6.25 MG PO TABS: 6.2500 mg | ORAL_TABLET | Freq: Two times a day (BID) | ORAL | 1 refills | Status: DC

## 2020-06-13 NOTE — Patient Instructions (Addendum)
It was nice meeting you today!  We would like your blood pressure to stay less than 130/80 and you are right there  Continue to watch your diet and salt intake  Continue your amlodipine 5 mg once a day Continue your losartan 100mg  once a day Continue your carvedilol 6.25mg  twice a day  Continue to check your blood pressure at home and please call if it starts to trend high  Call with any questions!  [place], PharmD, BCACP, CDCES, CPP [facility] [address], [place], [place] Phone: [phone] Fax: [phone] 06/13/2020 2:41 PM

## 2020-06-13 NOTE — Progress Notes (Signed)
Patient ID: Greg Velez                 DOB: Feb 28, 1975                      MRN: [id number]     HPI: Greg Velez is a 45 y.o. male referred by Dr. Shari Prows to HTN clinic. PMH is significant for HTN, COPD, obesity, HLD, and congential deaf-mutism.  This is patient's third visit to hypertension clinic and medications have slowly been titrated.  Patient presents today with wife and sign language interpreter.  Has been working on diet changes and wife has been helping him with his medication.  Has been trying to avoid salt and is eating smaller portions.  Has switched to Greg Velez for seasoning.  Wife organizes his pill organizer and helps remind him to take medications.  Reports no adverse effects to any current meds.  Uses wrist cuff at home.  Brought log:  4/22:123/79 4/21: 114/77 4/20: 126/78 4/19: 129/83 4/18: 123/77 4/17: 132/83 4/16: 127/90 4/15: 123/83 4/14: 117/74 4/13: 110/73 4/12: 129/83  Current HTN meds: amlodipine 5 mg daily, carvedilol 6.25g BID, losartan 100mg  daily Previously tried: lisinopril 10mg  BP goal: <130/80  Family History:   Social History:   Diet:   Exercise:   Home BP readings:   Wt Readings from Last 3 Encounters:  03/27/20 (!) 313 lb (142 kg)  07/18/19 280 lb (127 kg)   BP Readings from Last 3 Encounters:  05/09/20 132/80  04/18/20 (!) 148/100  04/04/20 (!) 140/102   Pulse Readings from Last 3 Encounters:  05/09/20 68  04/18/20 60  04/04/20 71    Renal function: CrCl cannot be calculated (Patient's most recent lab result is older than the maximum 21 days allowed.).  Past Medical History:  Diagnosis Date  . Hypertension     Current Outpatient Medications on File Prior to Visit  Medication Sig Dispense Refill  . amLODipine (NORVASC) 5 MG tablet Take 1 tablet (5 mg total) by mouth daily. 180 tablet 3  . atorvastatin (LIPITOR) 10 MG tablet Take by mouth.    . benzonatate (TESSALON) 200 MG capsule Take by mouth.    . Calcium  600-200 MG-UNIT tablet Take 1 tablet by mouth daily.    . carvedilol (COREG) 6.25 MG tablet Take 1 tablet (6.25 mg total) by mouth 2 (two) times daily. 180 tablet 1  . cyclobenzaprine (FLEXERIL) 10 MG tablet Take by mouth. (Patient not taking: Reported on 04/18/2020)    . ibuprofen (ADVIL) 600 MG tablet Take 1 tablet (600 mg total) by mouth every 6 (six) hours as needed. (Patient not taking: Reported on 04/18/2020) 30 tablet 0  . losartan (COZAAR) 100 MG tablet Take 1 tablet by mouth daily.    . methocarbamol (ROBAXIN) 500 MG tablet Take 1 tablet (500 mg total) by mouth 2 (two) times daily. 20 tablet 0  . omeprazole (PRILOSEC) 40 MG capsule Take 1 capsule (40 mg total) by mouth daily. 90 capsule 3   No current facility-administered medications on file prior to visit.    No Known Allergies   Assessment/Plan:  1. Hypertension - Patient BP in room today 130/80 which is right at goal.  Majority of home BP readings at goal and patient and wife are pleased with results.  Recommended patient continue to self monitor BP at home and call if BP results begin to trend up.  No med changes needed at this time.  Continue carvedilol 6.25mg   BID Continue losartan 100mg  Continue amlodipine 5 mg daily Recheck as needed  , PharmD, BCACP, CDCES, CPP [facility] [address] [place], [place] Phone: [phone]; Fax: [phone] 06/13/2020 3:44 PM

## 2020-07-03 NOTE — Progress Notes (Signed)
Cardiology Office Note:    Date:  07/04/2020   ID:  Salli Real, DOB [birth year], MRN [id number]  PCP:  Patient, No Pcp Per (Inactive)  [facility] Cardiologist:  Meriam Sprague, MD  [facility] Electrophysiologist:  None   Referring MD: No ref. provider found    History of Present Illness:    Delmos Velaquez is a 45 y.o. male with a hx of deafness, HTN, HLD and OSA who presents to clinic for follow-up of his chest pain.  Patient was in [facility] ED on 03/11/20 with sudden onset severe left sided stabbing chest pain after he was reaching for a container in the freezer. He stopped what he was doing but the pain persisted and he took ASA and nitro without relief. Went to ER where vitals stable, trop negative x2, ECG without ischemic changes. CT chest without acute pathology. Pain deemed to likely be MSK in nature and patient was discharged home.  Was last seen in clinic on 03/27/20 where he was complaining of atypical chest pain thought to be due to MSK in nature. He was notably hypertensive and we adjusted his medications and discussed low Na diet at length.  Today, the patient states that he is doing overall well. No chest pain, shortness of breath, LE edema, orthopnea, or PND. Blood pressure is much better controlled at home running 100s-140s. Mainly in the 120s. Tolerating medications as prescribed. Patient is active without exertional symptoms. He is trying to work on his salt intake. Admits to not eating a healthy diet.   Of note, the patient was previously seen by Cardiology at [facility]. Records reviewed:  "Echocardiogram January 16, 2018 showed normal left ventricular size with systolic function in the low normal range. There is mildly increased left ventricular wall thickness and mildly dilated left atrium with grade 1 diastolic dysfunction. Right ventricular size and function was felt to be normal. The ascending aorta was mildly dilated.  Pharmacologic stress testing with  nuclear imaging was obtained January 16, 2018. The result was normalwith no evidence of ischemia. Ejection fraction was calculated at 65%.Additionally, his C-reactive protein was normal."   History obtained with assistance of ASL interpreter.  Past Medical History:  Diagnosis Date  . Hypertension     No past surgical history on file.  Current Medications: Current Meds  Medication Sig  . benzonatate (TESSALON) 200 MG capsule Take by mouth.  . Calcium 600-200 MG-UNIT tablet Take 1 tablet by mouth daily.  . cyclobenzaprine (FLEXERIL) 10 MG tablet Take by mouth.  Marland Kitchen ibuprofen (ADVIL) 600 MG tablet Take 1 tablet (600 mg total) by mouth every 6 (six) hours as needed.  . methocarbamol (ROBAXIN) 500 MG tablet Take 1 tablet (500 mg total) by mouth 2 (two) times daily.  Marland Kitchen omeprazole (PRILOSEC) 40 MG capsule Take 1 capsule (40 mg total) by mouth daily.  . [DISCONTINUED] amLODipine (NORVASC) 5 MG tablet Take 1 tablet (5 mg total) by mouth daily.  . [DISCONTINUED] atorvastatin (LIPITOR) 10 MG tablet Take by mouth daily.  . [DISCONTINUED] carvedilol (COREG) 6.25 MG tablet Take 1 tablet (6.25 mg total) by mouth 2 (two) times daily.  . [DISCONTINUED] losartan (COZAAR) 100 MG tablet Take 1 tablet (100 mg total) by mouth daily.     Allergies:   Patient has no known allergies.   Social History   Socioeconomic History  . Marital status: Married    Spouse name: Not on file  . Number of children: Not on file  . Years of  education: Not on file  . Highest education level: Not on file  Occupational History  . Not on file  Tobacco Use  . Smoking status: Current Every Day Smoker  . Smokeless tobacco: Current User  Substance and Sexual Activity  . Alcohol use: Not on file  . Drug use: Not on file  . Sexual activity: Not on file  Other Topics Concern  . Not on file  Social History Narrative  . Not on file   Social Determinants of Health   Financial Resource Strain: Not on file  Food  Insecurity: Not on file  Transportation Needs: Not on file  Physical Activity: Not on file  Stress: Not on file  Social Connections: Not on file     Family History: The patient's family history is not on file.  ROS:   Please see the history of present illness.    Review of Systems  Constitutional: Negative for fever and malaise/fatigue.  HENT: Positive for hearing loss.   Eyes: Negative for blurred vision.  Respiratory: Negative for cough and shortness of breath.   Cardiovascular: Negative for chest pain, palpitations, orthopnea, claudication, leg swelling and PND.  Gastrointestinal: Negative for blood in stool.  Genitourinary: Negative for hematuria.  Musculoskeletal: Positive for joint pain. Negative for falls.  Neurological: Negative for dizziness and loss of consciousness.  Psychiatric/Behavioral: Negative for substance abuse.    EKGs/Labs/Other Studies Reviewed:    The following studies were reviewed today:  2019 TTE: Left Ventricle  Left ventricle size is normal.  The left ventricular systolic function is low normal. EF is visually  estimated at 50-55%  No regional wall motion abnormalities were noted.  No evidence of left ventricular mass or thrombus noted.  No evidence of ventricular septal defect.  Mildly increased left ventricle wall thickness.  Grade I Diastolic Dysfunction (Delayed relaxation).   Left Atrium  Mildly dilated left atrium.  No evidence of intracardiac shunting by color flow Doppler.  No evidence of atrial septal defect.   Right Atrium  Right atrial size is normal.   Right Ventricle  Normal right ventricular size and function.  A moderator band is seen in the right ventricle.   Mitral Valve  The mitral valve leaflets are mildly thickened.  No evidence of mitral valve stenosis.  Trace mitral regurgitation is present.   Tricuspid Valve  Tricuspid valve was not well visualized  There is trivial tricuspid  regurgitation.  The right ventricular systolic pressure is within normal limits at 25  mmHg.   Aortic Valve  The aortic valve istrileaflet.  Aortic valve leaflets are somewhat thickened.  No hemodynamically significant valvular aortic stenosis.   Pulmonic Valve  The pulmonic valve was not well visualized.  Trace pulmonic regurgitation present.   Miscellaneous  There is mild dilation of the aortic root.  There is mild dilation of the ascending aorta.  No obvious dissection could be visualized.  The Pulmonary artery is within normal limits.   Pharmacologic stress testing with nuclear imaging was obtained January 16, 2018. The result was normalwith no evidence of ischemia. Ejection fraction was calculated at 65%.  TTE 04/18/20: IMPRESSIONS    1. On today's study, unable to reproduce report of dilated sinus of  Valsalva measurements. Aorta appears upper limit of normal caliber.  Consider cross sectional imaging if concerns for dilated aorta persist.  2. Left ventricular ejection fraction, by estimation, is 55 to 60%. The  left ventricle has normal function. The left ventricle has no regional  wall  motion abnormalities. Left ventricular diastolic parameters were  normal.  3. Right ventricular systolic function is normal. The right ventricular  size is normal. Tricuspid regurgitation signal is inadequate for assessing  PA pressure.  4. The mitral valve is normal in structure. Trivial mitral valve  regurgitation. No evidence of mitral stenosis.  5. The aortic valve is tricuspid. Aortic valve regurgitation is not  visualized. No aortic stenosis is present.   CT Chest with Contrast 03/11/20: FINDINGS: Cardiovascular: Nonaneurysmal aorta. Normal heart size. No pericardial effusion  Mediastinum/Nodes: No enlarged mediastinal, hilar, or axillary lymph nodes. Thyroid gland, trachea, and esophagus demonstrate no significant findings.  Lungs/Pleura: Lungs  are clear. No pleural effusion or pneumothorax.  Upper Abdomen: No acute abnormality.  Musculoskeletal: No chest wall abnormality. No acute or significant osseous findings.  IMPRESSION: Negative. No CT evidence for acute intrathoracic abnormality. Negative for pulmonary nodule. Findings on recent chest radiography felt secondary to prominent left first rib articulation with the sternum.  EKG:  EKG 03/12/20: NSR with left posterior block  Recent Labs: 03/11/2020: B Natriuretic Peptide 7.4; Hemoglobin 16.1; Platelets 189 04/04/2020: BUN 11; Creatinine, Ser 0.99; Potassium 4.4; Sodium 144  Recent Lipid Panel    Component Value Date/Time   CHOL 145 04/04/2020 1135   TRIG 110 04/04/2020 1135   HDL 45 04/04/2020 1135   CHOLHDL 3.2 04/04/2020 1135   LDLCALC 80 04/04/2020 1135      Physical Exam:    VS:  BP 138/90   Pulse 63   Ht 6\' 2"  (1.88 m)   Wt (!) 306 lb 12.8 oz (139.2 kg)   SpO2 94%   BMI 39.39 kg/m     Wt Readings from Last 3 Encounters:  07/04/20 (!) 306 lb 12.8 oz (139.2 kg)  03/27/20 (!) 313 lb (142 kg)  07/18/19 280 lb (127 kg)     GEN:  Congenitally deaf. Comfortable, NAD HEENT: Normal NECK: No JVD; No carotid bruits CARDIAC: RRR, no murmurs, rubs, gallops RESPIRATORY:  CTAB, no wheezes ABDOMEN: Soft, non-tender, non-distended MUSCULOSKELETAL:  No edema; No deformity  SKIN: Warm and dry NEUROLOGIC:  Alert and oriented x 3 PSYCHIATRIC:  Normal affect   ASSESSMENT:    1. Primary hypertension    PLAN:    In order of problems listed above:  #Atypical Chest Pain: Resolved. Likely MSK in nature as symptoms developed after reaching for something high in the fridge. Work-up including ECG, trops, CT chest reassuringly normal. Myoview 2019 norma. TTE in 2019 with EF 50-55% but no WMA.  -Continue to monitor  #HTN: Much batter controlled mainly 120s at home. -Continue losartan 100mg  daily -Continue coreg 6.25mg  BID  -Continue amlodipine 5mg   daily -Continue low Na diet  #Dilated ascending aorta: Not dilated on CTA of chest. -Continue blood pressure control -No further imaging needed as normal size on CTA  #HLD: LDL 80 on 04/04/20. -Continue atorvastatin 10mg   #Tobacco Abuse: -Continue to encourage tobacco cessation  #Morbid Obesity:  BMI 40.  -Discussed diet and exercise at length as detailed below. Recommend Mediterranean diet  Exercise recommendations: Goal of exercising for at least 30 minutes a day, at least 5 times per week.  Please exercise to a moderate exertion.  This means that while exercising it is difficult to speak in full sentences, however you are not so short of breath that you feel you must stop, and not so comfortable that you can carry on a full conversation.  Exertion level should be approximately a 5/10, if 10 is the  most exertion you can perform.  Diet recommendations: Recommend a heart healthy diet such as the Mediterranean diet.  This diet consists of plant based foods, healthy fats, lean meats, olive oil.  It suggests limiting the intake of simple carbohydrates such as white breads, pastries, and pastas.  It also limits the amount of red meat, wine, and dairy products such as cheese that one should consume on a daily basis.   Medication Adjustments/Labs and Tests Ordered: Current medicines are reviewed at length with the patient today.  Concerns regarding medicines are outlined above.  No orders of the defined types were placed in this encounter.  Meds ordered this encounter  Medications  . losartan (COZAAR) 100 MG tablet    Sig: Take 1 tablet (100 mg total) by mouth daily.    Dispense:  90 tablet    Refill:  2  . carvedilol (COREG) 6.25 MG tablet    Sig: Take 1 tablet (6.25 mg total) by mouth 2 (two) times daily.    Dispense:  180 tablet    Refill:  2  . atorvastatin (LIPITOR) 10 MG tablet    Sig: Take 1 tablet (10 mg total) by mouth daily.    Dispense:  90 tablet    Refill:  2  .  amLODipine (NORVASC) 5 MG tablet    Sig: Take 1 tablet (5 mg total) by mouth daily.    Dispense:  90 tablet    Refill:  2    Patient Instructions   Medication Instructions:   Your physician recommends that you continue on your current medications as directed. Please refer to the Current Medication list given to you today.  *If you need a refill on your cardiac medications before your next appointment, please call your pharmacy*   Follow-Up: At [facility], you and your health needs are our priority.  As part of our continuing mission to provide you with exceptional heart care, we have created designated Provider Care Teams.  These Care Teams include your primary Cardiologist (physician) and Advanced Practice Providers (APPs -  Physician Assistants and Nurse Practitioners) who all work together to provide you with the care you need, when you need it.  We recommend signing up for the patient portal called "MyChart".  Sign up information is provided on this After Visit Summary.  MyChart is used to connect with patients for Virtual Visits (Telemedicine).  Patients are able to view lab/test results, encounter notes, upcoming appointments, etc.  Non-urgent messages can be sent to your provider as well.   To learn more about what you can do with MyChart, go to [email or web address].    Your next appointment:   8 month(s)  The format for your next appointment:   In Person  Provider:   Laurance Flatten, MD   Other Instructions    Mediterranean Diet A Mediterranean diet refers to food and lifestyle choices that are based on the traditions of countries located on the [place]. This way of eating has been shown to help prevent certain conditions and improve outcomes for people who have chronic diseases, like kidney disease and heart disease. What are tips for following this plan? Lifestyle  Cook and eat meals together with your family, when possible.  Drink enough fluid  to keep your urine clear or pale yellow.  Be physically active every day. This includes: ? Aerobic exercise like running or swimming. ? Leisure activities like gardening, walking, or housework.  Get 7-8 hours of sleep each night.  If recommended  by your health care provider, drink red wine in moderation. This means 1 glass a day for nonpregnant women and 2 glasses a day for men. A glass of wine equals 5 oz (150 mL). Reading food labels  Check the serving size of packaged foods. For foods such as rice and pasta, the serving size refers to the amount of cooked product, not dry.  Check the total fat in packaged foods. Avoid foods that have saturated fat or trans fats.  Check the ingredients list for added sugars, such as corn syrup.   Shopping  At the grocery store, buy most of your food from the areas near the walls of the store. This includes: ? Fresh fruits and vegetables (produce). ? Grains, beans, nuts, and seeds. Some of these may be available in unpackaged forms or large amounts (in bulk). ? Fresh seafood. ? Poultry and eggs. ? Low-fat dairy products.  Buy whole ingredients instead of prepackaged foods.  Buy fresh fruits and vegetables in-season from local farmers markets.  Buy frozen fruits and vegetables in resealable bags.  If you do not have access to quality fresh seafood, buy precooked frozen shrimp or canned fish, such as tuna, salmon, or sardines.  Buy small amounts of raw or cooked vegetables, salads, or olives from the deli or salad bar at your store.  Stock your pantry so you always have certain foods on hand, such as olive oil, canned tuna, canned tomatoes, rice, pasta, and beans. Cooking  Cook foods with extra-virgin olive oil instead of using butter or other vegetable oils.  Have meat as a side dish, and have vegetables or grains as your main dish. This means having meat in small portions or adding small amounts of meat to foods like pasta or stew.  Use  beans or vegetables instead of meat in common dishes like chili or lasagna.  Experiment with different cooking methods. Try roasting or broiling vegetables instead of steaming or sauteing them.  Add frozen vegetables to soups, stews, pasta, or rice.  Add nuts or seeds for added healthy fat at each meal. You can add these to yogurt, salads, or vegetable dishes.  Marinate fish or vegetables using olive oil, lemon juice, garlic, and fresh herbs. Meal planning  Plan to eat 1 vegetarian meal one day each week. Try to work up to 2 vegetarian meals, if possible.  Eat seafood 2 or more times a week.  Have healthy snacks readily available, such as: ? Vegetable sticks with hummus. ? [place] yogurt. ? Fruit and nut trail mix.  Eat balanced meals throughout the week. This includes: ? Fruit: 2-3 servings a day ? Vegetables: 4-5 servings a day ? Low-fat dairy: 2 servings a day ? Fish, poultry, or lean meat: 1 serving a day ? Beans and legumes: 2 or more servings a week ? Nuts and seeds: 1-2 servings a day ? Whole grains: 6-8 servings a day ? Extra-virgin olive oil: 3-4 servings a day  Limit red meat and sweets to only a few servings a month   What are my food choices?  Mediterranean diet ? Recommended  Grains: Whole-grain pasta. Brown rice. Bulgar wheat. Polenta. Couscous. Whole-wheat bread. Orpah Cobb.  Vegetables: Artichokes. Beets. Broccoli. Cabbage. Carrots. Eggplant. Green beans. Chard. Kale. Spinach. Onions. Leeks. Peas. Squash. Tomatoes. Peppers. Radishes.  Fruits: Apples. Apricots. Avocado. Berries. Bananas. Cherries. Dates. Figs. Grapes. Lemons. Melon. Oranges. Peaches. Plums. Pomegranate.  Meats and other protein foods: Beans. Almonds. Sunflower seeds. Pine nuts. Peanuts. Cod. Salmon. Scallops. Shrimp.  Tuna. Tilapia. Clams. Oysters. Eggs.  Dairy: Low-fat milk. Cheese. Greek yogurt.  Beverages: Water. Red wine. Herbal tea.  Fats and oils: Extra virgin olive oil. Avocado  oil. Grape seed oil.  Sweets and desserts: [place] yogurt with honey. Baked apples. Poached pears. Trail mix.  Seasoning and other foods: Basil. Cilantro. Coriander. Cumin. Mint. Parsley. Sage. Rosemary. Tarragon. Garlic. Oregano. Thyme. Pepper. Balsalmic vinegar. Tahini. Hummus. Tomato sauce. Olives. Mushrooms. ? Limit these  Grains: Prepackaged pasta or rice dishes. Prepackaged cereal with added sugar.  Vegetables: Deep fried potatoes (french fries).  Fruits: Fruit canned in syrup.  Meats and other protein foods: Beef. Pork. Lamb. Poultry with skin. Hot dogs. Tomasa Blase.  Dairy: Ice cream. Sour cream. Whole milk.  Beverages: Juice. Sugar-sweetened soft drinks. Beer. Liquor and spirits.  Fats and oils: Butter. Canola oil. Vegetable oil. Beef fat (tallow). Lard.  Sweets and desserts: Cookies. Cakes. Pies. Candy.  Seasoning and other foods: Mayonnaise. Premade sauces and marinades. The items listed may not be a complete list. Talk with your dietitian about what dietary choices are right for you. Summary  The Mediterranean diet includes both food and lifestyle choices.  Eat a variety of fresh fruits and vegetables, beans, nuts, seeds, and whole grains.  Limit the amount of red meat and sweets that you eat.  Talk with your health care provider about whether it is safe for you to drink red wine in moderation. This means 1 glass a day for nonpregnant women and 2 glasses a day for men. A glass of wine equals 5 oz (150 mL). This information is not intended to replace advice given to you by your health care provider. Make sure you discuss any questions you have with your health care provider. Document Revised: 10/09/2015 Document Reviewed: 10/02/2015 Elsevier Patient Education  2020 [employer].     Signed, Meriam Sprague, MD  07/04/2020 10:14 AM    [facility]

## 2020-07-04 ENCOUNTER — Other Ambulatory Visit: Payer: Self-pay

## 2020-07-04 ENCOUNTER — Encounter: Payer: Self-pay | Admitting: Cardiology

## 2020-07-04 ENCOUNTER — Ambulatory Visit: Payer: BC Managed Care – PPO | Admitting: Cardiology

## 2020-07-04 VITALS — BP 138/90 | HR 63 | Ht 74.0 in | Wt 306.8 lb

## 2020-07-04 DIAGNOSIS — I10 Essential (primary) hypertension: Secondary | ICD-10-CM

## 2020-07-04 DIAGNOSIS — I77819 Aortic ectasia, unspecified site: Secondary | ICD-10-CM | POA: Diagnosis not present

## 2020-07-04 DIAGNOSIS — R0789 Other chest pain: Secondary | ICD-10-CM

## 2020-07-04 DIAGNOSIS — Z720 Tobacco use: Secondary | ICD-10-CM | POA: Diagnosis not present

## 2020-07-04 DIAGNOSIS — E782 Mixed hyperlipidemia: Secondary | ICD-10-CM

## 2020-07-04 MED ORDER — CARVEDILOL 6.25 MG PO TABS: 6.2500 mg | ORAL_TABLET | Freq: Two times a day (BID) | ORAL | 2 refills | Status: AC

## 2020-07-04 MED ORDER — LOSARTAN POTASSIUM 100 MG PO TABS: 1.0000 | ORAL_TABLET | Freq: Every day | ORAL | 2 refills | Status: AC

## 2020-07-04 MED ORDER — ATORVASTATIN CALCIUM 10 MG PO TABS: 10.0000 mg | ORAL_TABLET | Freq: Every day | ORAL | 2 refills | Status: AC

## 2020-07-04 MED ORDER — AMLODIPINE BESYLATE 5 MG PO TABS: 5.0000 mg | ORAL_TABLET | Freq: Every day | ORAL | 2 refills | Status: DC

## 2020-07-04 NOTE — Patient Instructions (Addendum)
Medication Instructions:   Your physician recommends that you continue on your current medications as directed. Please refer to the Current Medication list given to you today.  *If you need a refill on your cardiac medications before your next appointment, please call your pharmacy*   Follow-Up: At [facility], you and your health needs are our priority.  As part of our continuing mission to provide you with exceptional heart care, we have created designated Provider Care Teams.  These Care Teams include your primary Cardiologist (physician) and Advanced Practice Providers (APPs -  Physician Assistants and Nurse Practitioners) who all work together to provide you with the care you need, when you need it.  We recommend signing up for the patient portal called "MyChart".  Sign up information is provided on this After Visit Summary.  MyChart is used to connect with patients for Virtual Visits (Telemedicine).  Patients are able to view lab/test results, encounter notes, upcoming appointments, etc.  Non-urgent messages can be sent to your provider as well.   To learn more about what you can do with MyChart, go to [email or web address].    Your next appointment:   8 month(s)  The format for your next appointment:   In Person  Provider:   Laurance Flatten, MD   Other Instructions    Mediterranean Diet A Mediterranean diet refers to food and lifestyle choices that are based on the traditions of countries located on the [place]. This way of eating has been shown to help prevent certain conditions and improve outcomes for people who have chronic diseases, like kidney disease and heart disease. What are tips for following this plan? Lifestyle  Cook and eat meals together with your family, when possible.  Drink enough fluid to keep your urine clear or pale yellow.  Be physically active every day. This includes: ? Aerobic exercise like running or swimming. ? Leisure  activities like gardening, walking, or housework.  Get 7-8 hours of sleep each night.  If recommended by your health care provider, drink red wine in moderation. This means 1 glass a day for nonpregnant women and 2 glasses a day for men. A glass of wine equals 5 oz (150 mL). Reading food labels  Check the serving size of packaged foods. For foods such as rice and pasta, the serving size refers to the amount of cooked product, not dry.  Check the total fat in packaged foods. Avoid foods that have saturated fat or trans fats.  Check the ingredients list for added sugars, such as corn syrup.   Shopping  At the grocery store, buy most of your food from the areas near the walls of the store. This includes: ? Fresh fruits and vegetables (produce). ? Grains, beans, nuts, and seeds. Some of these may be available in unpackaged forms or large amounts (in bulk). ? Fresh seafood. ? Poultry and eggs. ? Low-fat dairy products.  Buy whole ingredients instead of prepackaged foods.  Buy fresh fruits and vegetables in-season from local farmers markets.  Buy frozen fruits and vegetables in resealable bags.  If you do not have access to quality fresh seafood, buy precooked frozen shrimp or canned fish, such as tuna, salmon, or sardines.  Buy small amounts of raw or cooked vegetables, salads, or olives from the deli or salad bar at your store.  Stock your pantry so you always have certain foods on hand, such as olive oil, canned tuna, canned tomatoes, rice, pasta, and beans. Cooking  Cook foods with extra-virgin  olive oil instead of using butter or other vegetable oils.  Have meat as a side dish, and have vegetables or grains as your main dish. This means having meat in small portions or adding small amounts of meat to foods like pasta or stew.  Use beans or vegetables instead of meat in common dishes like chili or lasagna.  Experiment with different cooking methods. Try roasting or broiling  vegetables instead of steaming or sauteing them.  Add frozen vegetables to soups, stews, pasta, or rice.  Add nuts or seeds for added healthy fat at each meal. You can add these to yogurt, salads, or vegetable dishes.  Marinate fish or vegetables using olive oil, lemon juice, garlic, and fresh herbs. Meal planning  Plan to eat 1 vegetarian meal one day each week. Try to work up to 2 vegetarian meals, if possible.  Eat seafood 2 or more times a week.  Have healthy snacks readily available, such as: ? Vegetable sticks with hummus. ? [place] yogurt. ? Fruit and nut trail mix.  Eat balanced meals throughout the week. This includes: ? Fruit: 2-3 servings a day ? Vegetables: 4-5 servings a day ? Low-fat dairy: 2 servings a day ? Fish, poultry, or lean meat: 1 serving a day ? Beans and legumes: 2 or more servings a week ? Nuts and seeds: 1-2 servings a day ? Whole grains: 6-8 servings a day ? Extra-virgin olive oil: 3-4 servings a day  Limit red meat and sweets to only a few servings a month   What are my food choices?  Mediterranean diet ? Recommended  Grains: Whole-grain pasta. Brown rice. Bulgar wheat. Polenta. Couscous. Whole-wheat bread. Orpah Cobb.  Vegetables: Artichokes. Beets. Broccoli. Cabbage. Carrots. Eggplant. Green beans. Chard. Kale. Spinach. Onions. Leeks. Peas. Squash. Tomatoes. Peppers. Radishes.  Fruits: Apples. Apricots. Avocado. Berries. Bananas. Cherries. Dates. Figs. Grapes. Lemons. Melon. Oranges. Peaches. Plums. Pomegranate.  Meats and other protein foods: Beans. Almonds. Sunflower seeds. Pine nuts. Peanuts. Cod. Salmon. Scallops. Shrimp. Tuna. Tilapia. Clams. Oysters. Eggs.  Dairy: Low-fat milk. Cheese. Greek yogurt.  Beverages: Water. Red wine. Herbal tea.  Fats and oils: Extra virgin olive oil. Avocado oil. Grape seed oil.  Sweets and desserts: [place] yogurt with honey. Baked apples. Poached pears. Trail mix.  Seasoning and other foods: Basil.  Cilantro. Coriander. Cumin. Mint. Parsley. Sage. Rosemary. Tarragon. Garlic. Oregano. Thyme. Pepper. Balsalmic vinegar. Tahini. Hummus. Tomato sauce. Olives. Mushrooms. ? Limit these  Grains: Prepackaged pasta or rice dishes. Prepackaged cereal with added sugar.  Vegetables: Deep fried potatoes (french fries).  Fruits: Fruit canned in syrup.  Meats and other protein foods: Beef. Pork. Lamb. Poultry with skin. Hot dogs. Tomasa Blase.  Dairy: Ice cream. Sour cream. Whole milk.  Beverages: Juice. Sugar-sweetened soft drinks. Beer. Liquor and spirits.  Fats and oils: Butter. Canola oil. Vegetable oil. Beef fat (tallow). Lard.  Sweets and desserts: Cookies. Cakes. Pies. Candy.  Seasoning and other foods: Mayonnaise. Premade sauces and marinades. The items listed may not be a complete list. Talk with your dietitian about what dietary choices are right for you. Summary  The Mediterranean diet includes both food and lifestyle choices.  Eat a variety of fresh fruits and vegetables, beans, nuts, seeds, and whole grains.  Limit the amount of red meat and sweets that you eat.  Talk with your health care provider about whether it is safe for you to drink red wine in moderation. This means 1 glass a day for nonpregnant women and 2 glasses a day  for men. A glass of wine equals 5 oz (150 mL). This information is not intended to replace advice given to you by your health care provider. Make sure you discuss any questions you have with your health care provider. Document Revised: 10/09/2015 Document Reviewed: 10/02/2015 Elsevier Patient Education  [place].

## 2020-09-03 ENCOUNTER — Ambulatory Visit (HOSPITAL_COMMUNITY)
Admission: EM | Admit: 2020-09-03 | Discharge: 2020-09-03 | Disposition: A | Discharge status: Home / Self Care | Payer: BC Managed Care – PPO | Attending: Family Medicine | Admitting: Family Medicine

## 2020-09-03 ENCOUNTER — Other Ambulatory Visit: Payer: Self-pay

## 2020-09-03 ENCOUNTER — Encounter (HOSPITAL_COMMUNITY): Payer: Self-pay

## 2020-09-03 DIAGNOSIS — R059 Cough, unspecified: Secondary | ICD-10-CM

## 2020-09-03 DIAGNOSIS — J3489 Other specified disorders of nose and nasal sinuses: Secondary | ICD-10-CM

## 2020-09-03 DIAGNOSIS — J029 Acute pharyngitis, unspecified: Secondary | ICD-10-CM | POA: Diagnosis not present

## 2020-09-03 DIAGNOSIS — U071 COVID-19: Secondary | ICD-10-CM

## 2020-09-03 MED ORDER — PROMETHAZINE-DM 6.25-15 MG/5ML PO SYRP: 5.0000 mL | ORAL_SOLUTION | Freq: Four times a day (QID) | ORAL | 0 refills | Status: DC | PRN

## 2020-09-03 NOTE — ED Triage Notes (Signed)
Pt presents with non productive cough, nasal drainage and sore throat since last night.  Pt tested positive with at home test today.

## 2020-09-03 NOTE — ED Provider Notes (Signed)
[facility]    CSN: [id number] Arrival date & time: 09/03/20  1651      History   Chief Complaint Chief Complaint  Patient presents with   URI    HPI Greg Velez is a 45 y.o. male.   Medical interpreter utilized today to facilitate visit with patient's consent.  Patient presenting today with dry cough, runny nose, sore scratchy throat since yesterday evening.  Symptoms have worsened somewhat throughout the day but he denies known fever, body aches, chest pain, shortness of breath, abdominal pain, nausea vomiting or diarrhea.  He has so far tried some TheraFlu which may have helped somewhat.  Took a home COVID test which was positive this morning.  No known history of pulmonary disease that he is aware of.   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   GERD (gastroesophageal reflux disease) 04/10/2020   Primary hypertension 04/04/2020   Class 2 obesity due to excess calories without serious comorbidity with body mass index (BMI) of 38.0 to 38.9 in adult 04/25/2018   Hypertensive heart disease 01/04/2018   Abnormal cardiac function test 01/04/2018   Chest pain with moderate risk for cardiac etiology 01/04/2018   Sleep-disordered breathing 01/04/2018   Snoring 01/04/2018   Intractable vomiting with nausea 11/18/2017   Chronic obstructive pulmonary disease (HCC) 12/02/2016   Depression 12/02/2016   Mixed hyperlipidemia 12/02/2016    History reviewed. No pertinent surgical history.     Home Medications    Prior to Admission medications   Medication Sig Start Date End Date Taking? Authorizing Provider  promethazine-dextromethorphan (PROMETHAZINE-DM) 6.25-15 MG/5ML syrup Take 5 mLs by mouth 4 (four) times daily as needed for cough. 09/03/20  Yes Particia Nearing, PA-C  amLODipine (NORVASC) 5 MG tablet Take 1 tablet (5 mg total) by mouth daily. 07/04/20    Meriam Sprague, MD  atorvastatin (LIPITOR) 10 MG tablet Take 1 tablet (10 mg total) by mouth daily. 07/04/20   Meriam Sprague, MD  benzonatate (TESSALON) 200 MG capsule Take by mouth. 01/25/18   [provider]  Calcium 600-200 MG-UNIT tablet Take 1 tablet by mouth daily.    [provider]  carvedilol (COREG) 6.25 MG tablet Take 1 tablet (6.25 mg total) by mouth 2 (two) times daily. 07/04/20   Meriam Sprague, MD  cyclobenzaprine (FLEXERIL) 10 MG tablet Take by mouth. 11/03/17 01/05/27  [provider]  ibuprofen (ADVIL) 600 MG tablet Take 1 tablet (600 mg total) by mouth every 6 (six) hours as needed. 03/11/20   Lorelee New, PA-C  losartan (COZAAR) 100 MG tablet Take 1 tablet (100 mg total) by mouth daily. 07/04/20   Meriam Sprague, MD  methocarbamol (ROBAXIN) 500 MG tablet Take 1 tablet (500 mg total) by mouth 2 (two) times daily. 03/11/20   Lorelee New, PA-C  omeprazole (PRILOSEC) 40 MG capsule Take 1 capsule (40 mg total) by mouth daily. 05/09/20   Meriam Sprague, MD    Family History History reviewed. No pertinent family history.  Social History Social History   Tobacco Use   Smoking status: Every Day    Pack years: 0.00   Smokeless tobacco: Current     Allergies   Patient has no known allergies.   Review of Systems Review of Systems Per HPI  Physical Exam Triage Vital Signs ED Triage Vitals  Enc Vitals Group  BP 09/03/20 1852 138/90     Pulse Rate 09/03/20 1852 71     Resp 09/03/20 1852 20     Temp 09/03/20 1852 98.8 F (37.1 C)     Temp Source 09/03/20 1852 Oral     SpO2 09/03/20 1852 100 %     Weight --      Height --      Head Circumference --      Peak Flow --      Pain Score 09/03/20 1851 6     Pain Loc --      Pain Edu? --      Excl. in GC? --    No data found.  Updated Vital Signs BP 138/90 (BP Location: Left Arm)   Pulse 71   Temp 98.8 F (37.1 C) (Oral)   Resp 20   SpO2 100%    Visual Acuity Right Eye Distance:   Left Eye Distance:   Bilateral Distance:    Right Eye Near:   Left Eye Near:    Bilateral Near:     Physical Exam Vitals and nursing note reviewed.  Constitutional:      Appearance: Normal appearance.  HENT:     Head: Atraumatic.     Right Ear: Tympanic membrane normal.     Left Ear: Tympanic membrane normal.     Nose: Rhinorrhea present.     Mouth/Throat:     Mouth: Mucous membranes are moist.     Pharynx: Oropharynx is clear. Posterior oropharyngeal erythema present. No oropharyngeal exudate.  Eyes:     Extraocular Movements: Extraocular movements intact.     Conjunctiva/sclera: Conjunctivae normal.  Cardiovascular:     Rate and Rhythm: Normal rate and regular rhythm.     Heart sounds: Normal heart sounds.  Pulmonary:     Effort: Pulmonary effort is normal.     Breath sounds: Normal breath sounds. No wheezing or rales.  Abdominal:     General: Bowel sounds are normal. There is no distension.     Palpations: Abdomen is soft.     Tenderness: There is no abdominal tenderness. There is no guarding.  Musculoskeletal:        General: Normal range of motion.     Cervical back: Normal range of motion and neck supple.  Skin:    General: Skin is warm and dry.  Neurological:     General: No focal deficit present.     Mental Status: He is oriented to person, place, and time.  Psychiatric:        Mood and Affect: Mood normal.        Thought Content: Thought content normal.        Judgment: Judgment normal.     UC Treatments / Results  Labs (all labs ordered are listed, but only abnormal results are displayed) Labs Reviewed - No data to display  EKG   Radiology No results found.  Procedures Procedures (including critical care time)  Medications Ordered in UC Medications - No data to display  Initial Impression / Assessment and Plan / UC Course  I have reviewed the triage vital signs and the nursing notes.  Pertinent labs  & imaging results that were available during my care of the patient were reviewed by me and considered in my medical decision making (see chart for details).     Vitals and exam very reassuring today.  Home COVID test was positive.  Discussed no need to repeat this as home test tend  to be very accurate.  We will send in Phenergan DM for cough, discussed over-the-counter medications and supportive home care.  Return precautions given for acutely worsening symptoms.  Work note given with Barrister's clerk.  Final Clinical Impressions(s) / UC Diagnoses   Final diagnoses:  COVID-19  Cough  Rhinorrhea  Sore throat   Discharge Instructions   None    ED Prescriptions     Medication Sig Dispense Auth. Provider   promethazine-dextromethorphan (PROMETHAZINE-DM) 6.25-15 MG/5ML syrup Take 5 mLs by mouth 4 (four) times daily as needed for cough. 100 mL Particia Nearing, [place]      PDMP not reviewed this encounter.   Particia Nearing, [place] 09/03/20 1945

## 2020-09-08 ENCOUNTER — Other Ambulatory Visit: Payer: Self-pay

## 2020-09-08 ENCOUNTER — Ambulatory Visit (HOSPITAL_COMMUNITY)
Admission: EM | Admit: 2020-09-08 | Discharge: 2020-09-08 | Disposition: A | Discharge status: Home / Self Care | Payer: BC Managed Care – PPO | Attending: Emergency Medicine | Admitting: Emergency Medicine

## 2020-09-08 ENCOUNTER — Encounter (HOSPITAL_COMMUNITY): Payer: Self-pay

## 2020-09-08 DIAGNOSIS — G933 Postviral fatigue syndrome: Secondary | ICD-10-CM | POA: Diagnosis not present

## 2020-09-08 DIAGNOSIS — G9331 Postviral fatigue syndrome: Secondary | ICD-10-CM

## 2020-09-08 MED ORDER — BENZONATATE 100 MG PO CAPS: 200.0000 mg | ORAL_CAPSULE | Freq: Three times a day (TID) | ORAL | 0 refills | Status: DC | PRN

## 2020-09-08 MED ORDER — ONDANSETRON 4 MG PO TBDP: 4.0000 mg | ORAL_TABLET | Freq: Three times a day (TID) | ORAL | 0 refills | Status: DC | PRN

## 2020-09-08 NOTE — ED Provider Notes (Signed)
[facility]    CSN: [id number] Arrival date & time: 09/08/20  1325      History   Chief Complaint Chief Complaint  Patient presents with   Post Covid    HPI Greg Velez is a 45 y.o. male.   Patient here for evaluation of chills, cough, nausea, and fatigue that have been ongoing for the past few days.  Reports testing positive for COVID on 7/13.  Reports 5 days quarantine was completed and denies any recent fevers but does report some diaphoresis at work earlier today.  Temperature 98.1 in office.  Denies any trauma, injury, or other precipitating event.  Denies any specific alleviating or aggravating factors.  Denies any fevers, chest pain, V/D, numbness, tingling, abdominal pain, or headaches.     The history is provided by the patient.   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   GERD (gastroesophageal reflux disease) 04/10/2020   Primary hypertension 04/04/2020   Class 2 obesity due to excess calories without serious comorbidity with body mass index (BMI) of 38.0 to 38.9 in adult 04/25/2018   Hypertensive heart disease 01/04/2018   Abnormal cardiac function test 01/04/2018   Chest pain with moderate risk for cardiac etiology 01/04/2018   Sleep-disordered breathing 01/04/2018   Snoring 01/04/2018   Intractable vomiting with nausea 11/18/2017   Chronic obstructive pulmonary disease (HCC) 12/02/2016   Depression 12/02/2016   Mixed hyperlipidemia 12/02/2016    History reviewed. No pertinent surgical history.     Home Medications    Prior to Admission medications   Medication Sig Start Date End Date Taking? Authorizing Provider  benzonatate (TESSALON PERLES) 100 MG capsule Take 2 capsules (200 mg total) by mouth 3 (three) times daily as needed for cough. 09/08/20  Yes Ivette Loyal, NP  ondansetron (ZOFRAN ODT) 4 MG disintegrating tablet Take 1  tablet (4 mg total) by mouth every 8 (eight) hours as needed for nausea or vomiting. 09/08/20  Yes Ivette Loyal, NP  amLODipine (NORVASC) 5 MG tablet Take 1 tablet (5 mg total) by mouth daily. 07/04/20   Meriam Sprague, MD  atorvastatin (LIPITOR) 10 MG tablet Take 1 tablet (10 mg total) by mouth daily. 07/04/20   Meriam Sprague, MD  Calcium 600-200 MG-UNIT tablet Take 1 tablet by mouth daily.    [provider]  carvedilol (COREG) 6.25 MG tablet Take 1 tablet (6.25 mg total) by mouth 2 (two) times daily. 07/04/20   Meriam Sprague, MD  cyclobenzaprine (FLEXERIL) 10 MG tablet Take by mouth. 11/03/17 01/05/27  [provider]  ibuprofen (ADVIL) 600 MG tablet Take 1 tablet (600 mg total) by mouth every 6 (six) hours as needed. 03/11/20   Lorelee New, PA-C  losartan (COZAAR) 100 MG tablet Take 1 tablet (100 mg total) by mouth daily. 07/04/20   Meriam Sprague, MD  methocarbamol (ROBAXIN) 500 MG tablet Take 1 tablet (500 mg total) by mouth 2 (two) times daily. 03/11/20   Lorelee New, PA-C  omeprazole (PRILOSEC) 40 MG capsule Take 1 capsule (40 mg total) by mouth daily. 05/09/20   Meriam Sprague, MD  promethazine-dextromethorphan (PROMETHAZINE-DM) 6.25-15 MG/5ML syrup Take 5 mLs by mouth 4 (four) times daily as needed for cough. 09/03/20   Particia Nearing, PA-C    Family History History reviewed. No pertinent family history.  Social History Social History  Tobacco Use   Smoking status: Every Day   Smokeless tobacco: Current     Allergies   Patient has no known allergies.   Review of Systems Review of Systems  Constitutional:  Positive for diaphoresis and fatigue. Negative for fever.  HENT:  Negative for congestion, sore throat, trouble swallowing and voice change.   Respiratory:  Positive for cough and shortness of breath.   Cardiovascular:  Negative for chest pain.  Gastrointestinal:  Positive for nausea. Negative for vomiting.   Musculoskeletal:  Positive for myalgias.  Neurological:  Positive for weakness. Negative for headaches.  All other systems reviewed and are negative.   Physical Exam Triage Vital Signs ED Triage Vitals  Enc Vitals Group     BP 09/08/20 1520 118/75     Pulse Rate 09/08/20 1520 70     Resp 09/08/20 1520 20     Temp 09/08/20 1520 98.1 F (36.7 C)     Temp Source 09/08/20 1508 Oral     SpO2 09/08/20 1520 97 %     Weight --      Height --      Head Circumference --      Peak Flow --      Pain Score 09/08/20 1520 5     Pain Loc --      Pain Edu? --      Excl. in GC? --    No data found.  Updated Vital Signs BP 118/75 (BP Location: Left Arm)   Pulse 70   Temp 98.1 F (36.7 C) (Oral)   Resp 20   SpO2 97%   Visual Acuity Right Eye Distance:   Left Eye Distance:   Bilateral Distance:    Right Eye Near:   Left Eye Near:    Bilateral Near:     Physical Exam Vitals and nursing note reviewed.  Constitutional:      General: He is not in acute distress.    Appearance: Normal appearance. He is not ill-appearing, toxic-appearing or diaphoretic.  HENT:     Head: Normocephalic and atraumatic.  Eyes:     Conjunctiva/sclera: Conjunctivae normal.  Cardiovascular:     Rate and Rhythm: Normal rate and regular rhythm.     Pulses: Normal pulses.     Heart sounds: Normal heart sounds.  Pulmonary:     Effort: Pulmonary effort is normal.     Breath sounds: Normal breath sounds.  Abdominal:     General: Abdomen is flat.  Musculoskeletal:        General: Normal range of motion.     Cervical back: Normal range of motion.  Skin:    General: Skin is warm and dry.  Neurological:     General: No focal deficit present.     Mental Status: He is alert and oriented to person, place, and time.  Psychiatric:        Mood and Affect: Mood normal.     UC Treatments / Results  Labs (all labs ordered are listed, but only abnormal results are displayed) Labs Reviewed - No data to  display  EKG   Radiology No results found.  Procedures Procedures (including critical care time)  Medications Ordered in UC Medications - No data to display  Initial Impression / Assessment and Plan / UC Course  I have reviewed the triage vital signs and the nursing notes.  Pertinent labs & imaging results that were available during my care of the patient were reviewed by me and considered in  my medical decision making (see chart for details).    Assessment negative for red flags or concerns.  Likely post viral syndrome vs long COVID.  Will prescribe zofran as needed for nausea and vomiting. May take tessalon perles as needed for cough.  Encouraged fluids and rest. Work note provided for patient to return to work on Thursday, reports that he works Sun-Wed.  ED follow up for any worsening shortness of breath or chest pain.  Follow up with primary care as needed.   Final Clinical Impressions(s) / UC Diagnoses   Final diagnoses:  Post viral syndrome     Discharge Instructions      You can take the Zofran every 8 hours as needed for nausea and vomiting.  It dissolves under your cough.  You can take the Tessalon perles as needed for cough.    Make sure you are drinking plenty of fluids and rest.   If you develop any chest pain go to the emergency department for further evaluation.    Return or go to the Emergency Department if symptoms worsen or do not improve in the next few days.      ED Prescriptions     Medication Sig Dispense Auth. Provider   ondansetron (ZOFRAN ODT) 4 MG disintegrating tablet Take 1 tablet (4 mg total) by mouth every 8 (eight) hours as needed for nausea or vomiting. 20 tablet Ivette Loyal, NP   benzonatate (TESSALON PERLES) 100 MG capsule Take 2 capsules (200 mg total) by mouth 3 (three) times daily as needed for cough. 20 capsule Ivette Loyal, NP      PDMP not reviewed this encounter.   Ivette Loyal, NP 09/08/20 1600

## 2020-09-08 NOTE — ED Triage Notes (Signed)
Pt presents post covid (7/13) with chills, weakness, nausea, and fatigue.

## 2020-09-08 NOTE — Discharge Instructions (Addendum)
You can take the Zofran every 8 hours as needed for nausea and vomiting.  It dissolves under your cough.  You can take the Tessalon perles as needed for cough.    Make sure you are drinking plenty of fluids and rest.   If you develop any chest pain go to the emergency department for further evaluation.    Return or go to the Emergency Department if symptoms worsen or do not improve in the next few days.  

## 2021-05-18 ENCOUNTER — Other Ambulatory Visit: Payer: Self-pay | Admitting: Urology

## 2021-06-23 ENCOUNTER — Telehealth: Payer: Self-pay | Admitting: Cardiology

## 2021-06-23 NOTE — Telephone Encounter (Signed)
I called the pt and call went through relay system as pt is deaf/HOH. Left message for the pt to call the office to set up tele pre op appt. Procedure is set for 06/25/21.  ?

## 2021-06-23 NOTE — Progress Notes (Signed)
Called surgery scheduler at Dr. Retta Diones to make aware pt will need cardiac clearance prior to surgery. ?

## 2021-06-23 NOTE — Telephone Encounter (Signed)
? ?  Pre-operative Risk Assessment  ?  ?Patient Name: Greg Velez  ?DOB: November 14, 1975 ?MRN: [id number]  ? ?  ? ?Request for Surgical Clearance   ? ?Procedure:  circumcision ? ?Date of Surgery:  Clearance 06/25/21                              ?   ?Surgeon:  Marcine Matar ?Surgeon's Group or Practice Name:  [facility] ?Phone number:  [phone] ext: [phone] ?Fax number:  [phone] ?  ?Type of Clearance Requested:   ?- Medical  ?  ?Type of Anesthesia:  General  ?  ?Additional requests/questions:   ? ?Signed, ?Leah Newnam   ?06/23/2021, 10:16 AM   ?

## 2021-06-23 NOTE — Telephone Encounter (Signed)
Preoperative team, please contact this patient and set up a phone call appointment for further cardiac evaluation.  Thank you for your help. ? ?Thomasene Ripple. Brayah Urquilla NP-C ? ?  ?06/23/2021, 10:33 AM ?[facility] ?[address] ?Office [phone] Fax [phone] ? ?

## 2021-06-23 NOTE — Telephone Encounter (Signed)
I will send FYI to requesting office the pt will need an appt. We are trying to set up a telephone appt through the relay system for the deaf.  ?

## 2021-06-24 ENCOUNTER — Other Ambulatory Visit: Payer: Self-pay

## 2021-06-24 ENCOUNTER — Encounter (HOSPITAL_BASED_OUTPATIENT_CLINIC_OR_DEPARTMENT_OTHER): Payer: Self-pay | Admitting: Urology

## 2021-06-24 ENCOUNTER — Encounter: Payer: Self-pay | Admitting: Physician Assistant

## 2021-06-24 ENCOUNTER — Ambulatory Visit (INDEPENDENT_AMBULATORY_CARE_PROVIDER_SITE_OTHER): Payer: BC Managed Care – PPO | Admitting: Physician Assistant

## 2021-06-24 DIAGNOSIS — Z01810 Encounter for preprocedural cardiovascular examination: Secondary | ICD-10-CM | POA: Diagnosis not present
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
## 2021-06-24 NOTE — Telephone Encounter (Signed)
Follow Up: ? ? ? ? ?Patient was calling back with an Interpreter. ?

## 2021-06-24 NOTE — Telephone Encounter (Signed)
Was able to talk to pt's wife through relay system for the [place]. Pt has been scheduled for a tele pre op appt now. Med rec not done by C[facility], will be done by [facility]. Consent has been done.  ? ?  ?Patient Consent for Virtual Visit  ? ? ?   ? ?Greg Velez has provided verbal consent on 06/24/2021 for a virtual visit (video or telephone). ? ? ?CONSENT FOR VIRTUAL VISIT FOR:  Greg Velez  ?By participating in this virtual visit I agree to the following: ? ?I hereby voluntarily request, consent and authorize [facility] and its employed or contracted physicians, physician assistants, nurse practitioners or other licensed health care professionals (the Practitioner), to provide me with telemedicine health care services (the ?Services") as deemed necessary by the treating Practitioner. I acknowledge and consent to receive the Services by the Practitioner via telemedicine. I understand that the telemedicine visit will involve communicating with the Practitioner through live audiovisual communication technology and the disclosure of certain medical information by electronic transmission. I acknowledge that I have been given the opportunity to request an in-person assessment or other available alternative prior to the telemedicine visit and am voluntarily participating in the telemedicine visit. ? ?I understand that I have the right to withhold or withdraw my consent to the use of telemedicine in the course of my care at any time, without affecting my right to future care or treatment, and that the Practitioner or I may terminate the telemedicine visit at any time. I understand that I have the right to inspect all information obtained and/or recorded in the course of the telemedicine visit and may receive copies of available information for a reasonable fee.  I understand that some of the potential risks of receiving the Services via telemedicine include:  ?Delay or interruption in medical evaluation due to technological  equipment failure or disruption; ?Information transmitted may not be sufficient (e.g. poor resolution of images) to allow for appropriate medical decision making by the Practitioner; and/or  ?In rare instances, security protocols could fail, causing a breach of personal health information. ? ?Furthermore, I acknowledge that it is my responsibility to provide information about my medical history, conditions and care that is complete and accurate to the best of my ability. I acknowledge that Practitioner's advice, recommendations, and/or decision may be based on factors not within their control, such as incomplete or inaccurate data provided by me or distortions of diagnostic images or specimens that may result from electronic transmissions. I understand that the practice of medicine is not an exact science and that Practitioner makes no warranties or guarantees regarding treatment outcomes. I acknowledge that a copy of this consent can be made available to me via my patient portal ([facility]), or I can request a printed copy by calling the office of [facility].   ? ?I understand that my insurance will be billed for this visit.  ? ?I have read or had this consent read to me. ?I understand the contents of this consent, which adequately explains the benefits and risks of the Services being provided via telemedicine.  ?I have been provided ample opportunity to ask questions regarding this consent and the Services and have had my questions answered to my satisfaction. ?I give my informed consent for the services to be provided through the use of telemedicine in my medical care ? ? ? ?

## 2021-06-24 NOTE — Progress Notes (Signed)
? ?Virtual Visit via Telephone Note  ? ?This visit type was conducted due to national recommendations for restrictions regarding the COVID-19 Pandemic (e.g. social distancing) in an effort to limit this patient's exposure and mitigate transmission in our community.  Due to his co-morbid illnesses, this patient is at least at moderate risk for complications without adequate follow up.  This format is felt to be most appropriate for this patient at this time.  The patient did not have access to video technology/had technical difficulties with video requiring transitioning to audio format only (telephone).  All issues noted in this document were discussed and addressed.  No physical exam could be performed with this format.  Please refer to the patient's chart for his  consent to telehealth for [facility]. ? ?Evaluation Performed:  Preoperative cardiovascular risk assessment ?_____________  ? ?Date:  06/24/2021  ? ?Patient ID:  Greg Velez, Greg Velez [birth year], MRN [id number] ?Patient Location:  ?Home ?Provider location:   ?Office ? ?Primary Care Provider:  Patient, No Pcp Per (Inactive) ?Primary Cardiologist:  Meriam Sprague, MD ? ?Chief Complaint  ?  ?46 y.o. y/o male with a h/o HTN, HLD, OSA, atypical chest pain, who is pending circumcision, and presents today for telephonic preoperative cardiovascular risk assessment. ? ?Past Medical History  ?  ?Past Medical History:  ?Diagnosis Date  ? Hypertension   ? ?No past surgical history on file. ? ?Allergies ? ?No Known Allergies ? ?History of Present Illness  ?  ?Greg Velez is a 46 y.o. male who presents via Web designer for a telehealth visit today.  Pt was last seen in cardiology clinic on 07/04/21 by Dr. Shari Prows.  At that time Jeryn Cerney was doing well. He has a history of prior normal stress test in 2019.  The patient is now pending procedure as outlined above. Since his last visit, he states he has been doing well without any new cardiac symptoms  or recent chest pain, SOB, or syncope. He works in a [employer]. The visit was conducted today using a relay system with ASL translator. ? ? ?Home Medications  ?  ?Prior to Admission medications   ?Medication Sig Start Date End Date Taking? Authorizing Provider  ?amLODipine (NORVASC) 5 MG tablet Take 1 tablet (5 mg total) by mouth daily. 07/04/20  Yes Meriam Sprague, MD  ?atorvastatin (LIPITOR) 10 MG tablet Take 1 tablet (10 mg total) by mouth daily. 07/04/20  Yes Meriam Sprague, MD  ?benzonatate (TESSALON PERLES) 100 MG capsule Take 2 capsules (200 mg total) by mouth 3 (three) times daily as needed for cough. 09/08/20  Yes Ivette Loyal, NP  ?Calcium 600-200 MG-UNIT tablet Take 1 tablet by mouth daily.   Yes [provider]  ?carvedilol (COREG) 6.25 MG tablet Take 1 tablet (6.25 mg total) by mouth 2 (two) times daily. 07/04/20  Yes Meriam Sprague, MD  ?cyclobenzaprine (FLEXERIL) 10 MG tablet Take by mouth. 11/03/17 01/05/27 Yes [provider]  ?ibuprofen (ADVIL) 600 MG tablet Take 1 tablet (600 mg total) by mouth every 6 (six) hours as needed. 03/11/20  Yes Lorelee New, PA-C  ?losartan (COZAAR) 100 MG tablet Take 1 tablet (100 mg total) by mouth daily. 07/04/20  Yes Meriam Sprague, MD  ?methocarbamol (ROBAXIN) 500 MG tablet Take 1 tablet (500 mg total) by mouth 2 (two) times daily. 03/11/20  Yes Lorelee New, PA-C  ?omeprazole (PRILOSEC) 40 MG capsule Take 1 capsule (40 mg total) by mouth daily. 05/09/20  Yes Meriam Sprague, MD  ?ondansetron (ZOFRAN ODT) 4 MG disintegrating tablet Take 1 tablet (4 mg total) by mouth every 8 (eight) hours as needed for nausea or vomiting. 09/08/20  Yes Ivette Loyal, NP  ?promethazine-dextromethorphan (PROMETHAZINE-DM) 6.25-15 MG/5ML syrup Take 5 mLs by mouth 4 (four) times daily as needed for cough. 09/03/20  Yes Particia Nearing, PA-C  ? ? ?Physical Exam  ?  ?Vital Signs:  Shyhiem Vogt does not have vital signs available for  review today but states last BP 138/91, HR usually runs 90s.  ? ?Given telephonic nature of communication, physical exam is limited. ?AAOx3. NAD. Normal affect.  Speech and respirations are unlabored. ? ?Accessory Clinical Findings  ?  ?None ? ?Assessment & Plan  ?  ?1.  Preoperative Cardiovascular Risk Assessment: The patient affirms he has been doing well without any new cardiac symptoms. They are able to achieve over 4 METS by DASI without cardiac limitations. Therefore, based on ACC/AHA guidelines, the patient would be at acceptable risk for the planned procedure without further cardiovascular testing. The patient was advised that if he develops new symptoms prior to surgery to contact our office to arrange for a follow-up visit, and he verbalized understanding. ? ?A copy of this note will be routed to requesting surgeon. ? ?Time:   ?Today, I have spent 5 minutes with the patient with telehealth technology discussing medical history, symptoms, and management plan.   ? ? ?Laurann Montana, PA-C ? ?06/24/2021, 12:35 PM ? ? ? ?

## 2021-06-24 NOTE — Progress Notes (Signed)
I received call back from pt's wife, Eunice Blase, she stated that pt is at work. When asked if her husband plans to still have his surgery tomorrow 06-25-2021 she stated yes.  Wife told that he has to have cardiac clearance to have his surgery and that office has been trying to call him yesterday and today.  She verbalized understanding if he does not speak with them to get clearance the his procedure would cancelled  or rescheduled.  Wife stated that pt has break at 1400 today and did not get off work until [employer].  Advised wife, Eunice Blase, to call [facility] , tell who she is and husband name and dob , and reason she is calling to arrange for 1400 tele visit .  Hopefully they will be available.  Called and left message for OR scheduler for Dr Retta Diones , Monica Martinez, informed her of what wife said . ?

## 2021-06-24 NOTE — Telephone Encounter (Signed)
Left message x 2 through relay system for the deaf. Left message to call for tele appt. I will send FYI to requesting office that we still have not been able to reach the pt. Pt procedure may need to be cancelled until he s/w the pre op provider.  ?

## 2021-06-25 ENCOUNTER — Encounter (HOSPITAL_BASED_OUTPATIENT_CLINIC_OR_DEPARTMENT_OTHER): Payer: Self-pay | Admitting: Urology

## 2021-06-25 ENCOUNTER — Encounter (HOSPITAL_BASED_OUTPATIENT_CLINIC_OR_DEPARTMENT_OTHER)
Admission: RE | Disposition: A | Discharge status: Home / Self Care | Payer: Self-pay | Source: Home / Self Care | Attending: Urology

## 2021-06-25 ENCOUNTER — Other Ambulatory Visit: Payer: Self-pay

## 2021-06-25 ENCOUNTER — Ambulatory Visit (HOSPITAL_BASED_OUTPATIENT_CLINIC_OR_DEPARTMENT_OTHER): Payer: BC Managed Care – PPO | Admitting: Anesthesiology

## 2021-06-25 ENCOUNTER — Ambulatory Visit (HOSPITAL_BASED_OUTPATIENT_CLINIC_OR_DEPARTMENT_OTHER)
Admission: RE | Admit: 2021-06-25 | Discharge: 2021-06-25 | Disposition: A | Discharge status: Home / Self Care | Payer: BC Managed Care – PPO | Attending: Urology | Admitting: Urology

## 2021-06-25 DIAGNOSIS — Z87891 Personal history of nicotine dependence: Secondary | ICD-10-CM | POA: Diagnosis not present

## 2021-06-25 DIAGNOSIS — H9190 Unspecified hearing loss, unspecified ear: Secondary | ICD-10-CM | POA: Insufficient documentation

## 2021-06-25 DIAGNOSIS — K219 Gastro-esophageal reflux disease without esophagitis: Secondary | ICD-10-CM | POA: Insufficient documentation

## 2021-06-25 DIAGNOSIS — N471 Phimosis: Secondary | ICD-10-CM | POA: Insufficient documentation

## 2021-06-25 DIAGNOSIS — G4730 Sleep apnea, unspecified: Secondary | ICD-10-CM | POA: Insufficient documentation

## 2021-06-25 DIAGNOSIS — J449 Chronic obstructive pulmonary disease, unspecified: Secondary | ICD-10-CM | POA: Diagnosis not present

## 2021-06-25 DIAGNOSIS — I10 Essential (primary) hypertension: Secondary | ICD-10-CM | POA: Diagnosis not present

## 2021-06-25 HISTORY — DX: Phimosis: N47.1

## 2021-06-25 HISTORY — DX: Essential (primary) hypertension: I10

## 2021-06-25 HISTORY — DX: Thoracic aortic ectasia: I77.810

## 2021-06-25 HISTORY — DX: Vitamin D deficiency, unspecified: E55.9

## 2021-06-25 HISTORY — DX: Obstructive sleep apnea (adult) (pediatric): G47.33

## 2021-06-25 HISTORY — PX: CIRCUMCISION: SHX1350

## 2021-06-25 HISTORY — DX: Gastro-esophageal reflux disease without esophagitis: K21.9

## 2021-06-25 HISTORY — DX: Presence of spectacles and contact lenses: Z97.3

## 2021-06-25 HISTORY — DX: Deaf nonspeaking, not elsewhere classified: H91.3

## 2021-06-25 LAB — POCT I-STAT, CHEM 8
BUN: 17 mg/dL (ref 6–20)
Calcium, Ion: 1.24 mmol/L (ref 1.15–1.40)
Chloride: 106 mmol/L (ref 98–111)
Creatinine, Ser: 1 mg/dL (ref 0.61–1.24)
Glucose, Bld: 101 mg/dL — ABNORMAL HIGH (ref 70–99)
HCT: 42 % (ref 39.0–52.0)
Hemoglobin: 14.3 g/dL (ref 13.0–17.0)
Potassium: 3.9 mmol/L (ref 3.5–5.1)
Sodium: 144 mmol/L (ref 135–145)
TCO2: 28 mmol/L (ref 22–32)

## 2021-06-25 SURGERY — CIRCUMCISION, ADULT
Anesthesia: General | Site: Penis

## 2021-06-25 MED ORDER — 0.9 % SODIUM CHLORIDE (POUR BTL) OPTIME
TOPICAL | Status: DC | PRN
  Administered 2021-06-25: 500 mL

## 2021-06-25 MED ORDER — BUPIVACAINE HCL 0.25 % IJ SOLN
INTRAMUSCULAR | Status: DC | PRN
Start: 2021-06-25 — End: 2021-06-25
  Administered 2021-06-25: 10 mL

## 2021-06-25 MED ORDER — CEFAZOLIN SODIUM-DEXTROSE 2-4 GM/100ML-% IV SOLN
INTRAVENOUS | Status: AC
  Filled 2021-06-25: qty 100

## 2021-06-25 MED ORDER — FENTANYL CITRATE (PF) 100 MCG/2ML IJ SOLN
INTRAMUSCULAR | Status: DC | PRN
  Administered 2021-06-25: 100 ug via INTRAVENOUS

## 2021-06-25 MED ORDER — PHENYLEPHRINE HCL (PRESSORS) 10 MG/ML IV SOLN
INTRAVENOUS | Status: DC | PRN
  Administered 2021-06-25: 160 ug via INTRAVENOUS
  Administered 2021-06-25: 80 ug via INTRAVENOUS

## 2021-06-25 MED ORDER — DEXAMETHASONE SODIUM PHOSPHATE 4 MG/ML IJ SOLN
INTRAMUSCULAR | Status: DC | PRN
  Administered 2021-06-25: 8 mg via INTRAVENOUS

## 2021-06-25 MED ORDER — LACTATED RINGERS IV SOLN: INTRAVENOUS | Status: DC

## 2021-06-25 MED ORDER — EPHEDRINE SULFATE (PRESSORS) 50 MG/ML IJ SOLN
INTRAMUSCULAR | Status: DC | PRN
  Administered 2021-06-25: 5 mg via INTRAVENOUS

## 2021-06-25 MED ORDER — PROPOFOL 10 MG/ML IV BOLUS
INTRAVENOUS | Status: DC | PRN
  Administered 2021-06-25: 200 mg via INTRAVENOUS

## 2021-06-25 MED ORDER — MIDAZOLAM HCL 2 MG/2ML IJ SOLN
INTRAMUSCULAR | Status: DC | PRN
  Administered 2021-06-25: 2 mg via INTRAVENOUS

## 2021-06-25 MED ORDER — MIDAZOLAM HCL 2 MG/2ML IJ SOLN
INTRAMUSCULAR | Status: AC
  Filled 2021-06-25: qty 2

## 2021-06-25 MED ORDER — HYDROMORPHONE HCL 1 MG/ML IJ SOLN: 0.2500 mg | INTRAMUSCULAR | Status: DC | PRN

## 2021-06-25 MED ORDER — CEFAZOLIN IN SODIUM CHLORIDE 3-0.9 GM/100ML-% IV SOLN
INTRAVENOUS | Status: AC
  Filled 2021-06-25: qty 100

## 2021-06-25 MED ORDER — CEFAZOLIN IN SODIUM CHLORIDE 3-0.9 GM/100ML-% IV SOLN
3.0000 g | INTRAVENOUS | Status: AC
  Administered 2021-06-25: 3 g via INTRAVENOUS

## 2021-06-25 MED ORDER — DEXMEDETOMIDINE (PRECEDEX) IN NS 20 MCG/5ML (4 MCG/ML) IV SYRINGE
PREFILLED_SYRINGE | INTRAVENOUS | Status: DC | PRN
  Administered 2021-06-25: 8 ug via INTRAVENOUS

## 2021-06-25 MED ORDER — FENTANYL CITRATE (PF) 100 MCG/2ML IJ SOLN
INTRAMUSCULAR | Status: AC
  Filled 2021-06-25: qty 2

## 2021-06-25 MED ORDER — LIDOCAINE HCL (CARDIAC) PF 100 MG/5ML IV SOSY
PREFILLED_SYRINGE | INTRAVENOUS | Status: DC | PRN
  Administered 2021-06-25: 50 mg via INTRAVENOUS

## 2021-06-25 MED ORDER — GLYCOPYRROLATE 0.2 MG/ML IJ SOLN
INTRAMUSCULAR | Status: DC | PRN
  Administered 2021-06-25: .1 mg via INTRAVENOUS

## 2021-06-25 MED ORDER — ONDANSETRON HCL 4 MG/2ML IJ SOLN
INTRAMUSCULAR | Status: DC | PRN
  Administered 2021-06-25: 4 mg via INTRAVENOUS

## 2021-06-25 MED ORDER — CEFAZOLIN SODIUM-DEXTROSE 2-4 GM/100ML-% IV SOLN: 2.0000 g | INTRAVENOUS | Status: DC

## 2021-06-25 SURGICAL SUPPLY — 30 items
BLADE CLIPPER SENSICLIP SURGIC (BLADE) ×1 IMPLANT
BLADE SURG 15 STRL LF DISP TIS (BLADE) ×1 IMPLANT
BLADE SURG 15 STRL SS (BLADE) ×1
BNDG COHESIVE 1X5 TAN NS LF (GAUZE/BANDAGES/DRESSINGS) ×1 IMPLANT
BNDG COHESIVE 1X5 TAN STRL LF (GAUZE/BANDAGES/DRESSINGS) ×2 IMPLANT
BNDG CONFORM 2 STRL LF (GAUZE/BANDAGES/DRESSINGS) ×2 IMPLANT
COVER BACK TABLE 60X90IN (DRAPES) ×2 IMPLANT
COVER MAYO STAND STRL (DRAPES) ×2 IMPLANT
DRAPE LAPAROTOMY 100X72 PEDS (DRAPES) ×2 IMPLANT
ELECT REM PT RETURN 9FT ADLT (ELECTROSURGICAL) ×2
ELECTRODE REM PT RTRN 9FT ADLT (ELECTROSURGICAL) ×1 IMPLANT
GAUZE 4X4 16PLY ~~LOC~~+RFID DBL (SPONGE) ×2 IMPLANT
GAUZE PETROLATUM 1 X8 (GAUZE/BANDAGES/DRESSINGS) ×1 IMPLANT
GLOVE BIO SURGEON STRL SZ 6.5 (GLOVE) ×1 IMPLANT
GLOVE BIO SURGEON STRL SZ8 (GLOVE) ×2 IMPLANT
GLOVE BIOGEL PI IND STRL 7.0 (GLOVE) IMPLANT
GLOVE BIOGEL PI INDICATOR 7.0 (GLOVE) ×2
GOWN STRL REUS W/ TWL LRG LVL3 (GOWN DISPOSABLE) IMPLANT
GOWN STRL REUS W/TWL LRG LVL3 (GOWN DISPOSABLE) ×2 IMPLANT
GOWN STRL REUS W/TWL XL LVL3 (GOWN DISPOSABLE) ×2 IMPLANT
KIT TURNOVER CYSTO (KITS) ×2 IMPLANT
NEEDLE HYPO 22GX1.5 SAFETY (NEEDLE) ×2 IMPLANT
NS IRRIG 500ML POUR BTL (IV SOLUTION) ×2 IMPLANT
PACK BASIN DAY SURGERY FS (CUSTOM PROCEDURE TRAY) ×2 IMPLANT
PENCIL SMOKE EVACUATOR (MISCELLANEOUS) ×2 IMPLANT
SOL PREP POV-IOD 4OZ 10% (MISCELLANEOUS) ×1 IMPLANT
SUT CHROMIC 4 0 RB 1X27 (SUTURE) ×3 IMPLANT
SYR CONTROL 10ML LL (SYRINGE) ×2 IMPLANT
TOWEL OR 17X26 10 PK STRL BLUE (TOWEL DISPOSABLE) ×2 IMPLANT
TRAY DSU PREP LF (CUSTOM PROCEDURE TRAY) ×2 IMPLANT

## 2021-06-25 NOTE — Anesthesia Postprocedure Evaluation (Signed)
Anesthesia Post Note ? ?Patient: Greg Velez ? ?Procedure(s) Performed: CIRCUMCISION ADULT (Penis) ? ?  ? ?Patient location during evaluation: PACU ?Anesthesia Type: General ?Level of consciousness: awake ?Pain management: pain level controlled ?Vital Signs Assessment: post-procedure vital signs reviewed and stable ?Cardiovascular status: stable ?Postop Assessment: no apparent nausea or vomiting ?Anesthetic complications: no ? ? ?No notable events documented. ? ?Last Vitals:  ?Vitals:  ? 06/25/21 1030 06/25/21 1105  ?BP: 126/90 (!) 126/95  ?Pulse: 65 64  ?Resp: 20 16  ?Temp:  36.4 ?C  ?SpO2: 93% 96%  ?  ?Last Pain:  ?Vitals:  ? 06/25/21 1105  ?TempSrc:   ?PainSc: 2   ? ? ?  ?  ?  ?  ?  ?  ? ?Rasheedah Reis ? ? ? ? ?

## 2021-06-25 NOTE — Anesthesia Preprocedure Evaluation (Addendum)
Anesthesia Evaluation  ?Patient identified by MRN, date of birth, ID band ?Patient awake ? ? ? ?Reviewed: ?Allergy & Precautions, NPO status , Patient's Chart, lab work & pertinent test results ? ?Airway ?Mallampati: II ? ?TM Distance: >3 FB ? ? ? ? Dental ?  ?Pulmonary ?sleep apnea , COPD, Patient abstained from smoking., former smoker,  ?  ?breath sounds clear to auscultation ? ? ? ? ? ? Cardiovascular ?hypertension,  ?Rhythm:Regular Rate:Normal ? ? ?  ?Neuro/Psych ?  ? GI/Hepatic ?Neg liver ROS, GERD  ,  ?Endo/Other  ?negative endocrine ROS ? Renal/GU ?negative Renal ROS  ? ?  ?Musculoskeletal ? ? Abdominal ?  ?Peds ? Hematology ?  ?Anesthesia Other Findings ? ? Reproductive/Obstetrics ? ?  ? ? ? ? ? ? ? ? ? ? ? ? ? ?  ?  ? ? ? ? ? ? ? ?Anesthesia Physical ?Anesthesia Plan ? ?ASA: 3 ? ?Anesthesia Plan: General  ? ?Post-op Pain Management:   ? ?Induction: Intravenous ? ?PONV Risk Score and Plan: 3 and Ondansetron, Dexamethasone and Midazolam ? ?Airway Management Planned: LMA ? ?Additional Equipment:  ? ?Intra-op Plan:  ? ?Post-operative Plan:  ? ?Informed Consent: I have reviewed the patients History and Physical, chart, labs and discussed the procedure including the risks, benefits and alternatives for the proposed anesthesia with the patient or authorized representative who has indicated his/her understanding and acceptance.  ? ? ? ?Dental advisory given ? ?Plan Discussed with: Anesthesiologist and CRNA ? ?Anesthesia Plan Comments:   ? ? ? ? ? ? ?Anesthesia Quick Evaluation ? ?

## 2021-06-25 NOTE — Discharge Instructions (Addendum)
Postoperative instructions for circumcision ? ?Wound: ? ?Remove the dressing 2 mornings after surgery. ?In most cases your incision will have absorbable sutures that run along the course of your incision and will dissolve within the first 10-20 days. Some will fall out even earlier. Expect some redness as the sutures dissolved but this should occur only around the sutures. If there is generalized redness, especially with increasing pain or swelling, let us know. The penis will possibly get "black and blue" as the blood in the tissues spread. Sometimes the whole penis will turn colors. The black and blue is followed by a yellow and brown color. In time, all the discoloration will go away. ? ?Diet: ? ?You may return to your normal diet within 24 hours following your surgery. You may note some mild nausea and possibly vomiting the first 6-8 hours following surgery. This is usually due to the side effects of anesthesia, and will disappear quite soon. I would suggest clear liquids and a very light meal the first evening following your surgery. ? ?Activity: ? ?Your physical activity should be restricted the first 48 hours. During that time you should remain relatively inactive, moving about only when necessary. During the first 7-10 days following surgery he should avoid lifting any heavy objects (anything greater than 15 pounds), and avoid strenuous exercise. If you work, ask [place] specifically about your restrictions, both for work and home. We will write a note to your employer if needed. ? ?Ice packs can be placed on and off over the penis for the first 48 hours to help relieve the pain and keep the swelling down. Frozen peas or corn in a ZipLock bag can be frozen, used and re-frozen. Fifteen minutes on and 15 minutes off is a reasonable schedule.  ?No sexual activity for 1 month. ? ?Hygiene: ? ?You may shower 24 hours after your surgery. Make sure wound is clean and dry afterwards. Tub bathing should be restricted until  the seventh day. ? ?Medication: ?It is okay to take Advil, Aleve or Tylenol for discomfort. ? ?Problems you should report to [place]: ? ?Fever of 101.0 degrees Fahrenheit or greater. ?Moderate or severe swelling under the skin incision or involving the scrotum. ?Drug reaction such as hives, a rash, nausea or vomiting.  ?Difficulty voiding  ? ? ?Post Anesthesia Home Care Instructions ? ?Activity: ?Get plenty of rest for the remainder of the day. A responsible individual must stay with you for 24 hours following the procedure.  ?For the next 24 hours, DO NOT: ?-Drive a car ?-Advertising copywriter ?-Drink alcoholic beverages ?-Take any medication unless instructed by your physician ?-Make any legal decisions or sign important papers. ? ?Meals: ?Start with liquid foods such as gelatin or soup. Progress to regular foods as tolerated. Avoid greasy, spicy, heavy foods. If nausea and/or vomiting occur, drink only clear liquids until the nausea and/or vomiting subsides. Call your physician if vomiting continues. ? ?Special Instructions/Symptoms: ?Your throat may feel dry or sore from the anesthesia or the breathing tube placed in your throat during surgery. If this causes discomfort, gargle with warm salt water. The discomfort should disappear within 24 hours. ? ?
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
## 2021-06-25 NOTE — Op Note (Signed)
Preoperative diagnosis: Phimosis Postoperative diagnosis: Same  Procedure: Circumcision Surgeon: Kahmari Koller M. Chastidy Ranker, MD. Anesthesia: Gen. with penile block Specimen: foreskin Complications: none EBL:minimal  Indications: The patient was recently evaluated for phimosis. All risks and benefits of circumcision discussed. Full informed consent obtained. The patient now presents for definitive procedure.  Technique and findings: The patient was brought to the operating room. Successful induction of general anesthesia.  The patient was then prepped and draped in usual manner. Appropriate surgical timeout was performed. A penile block was then performed with 10cc of quarter percent plain Marcaine. Proximal and distal incision sites were marked with a pen, and appropriate circumferential incisions were created. The sleeve of redundant skin was removed with the bovie. Hemostatis was achieved using electrocautery.Quadrant sutures were placed with interrupted 4-0 chromic, with the phrenular suture being a "U" stitch. In between, the same 4-0 chromic was used to re approximate the skin edges with a running simple stitch.  The incision was wrapped with Xeroform gauze, a plain guaze wrap and Coban dressing. The patient was brought to recovery room in stable condition having had no obvious complications or problems. Sponge and needle counts were correct.       

## 2021-06-25 NOTE — Anesthesia Procedure Notes (Signed)
Procedure Name: LMA Insertion ?Date/Time: 06/25/2021 9:19 AM ?Performed by: Earmon Phoenix, CRNA ?Pre-anesthesia Checklist: Patient identified, Emergency Drugs available, Suction available, Patient being monitored and Timeout performed ?Patient Re-evaluated:Patient Re-evaluated prior to induction ?Oxygen Delivery Method: Circle system utilized ?Preoxygenation: Pre-oxygenation with 100% oxygen ?Induction Type: IV induction ?LMA: LMA inserted ?LMA Size: 5.0 ?Number of attempts: 1 ?Placement Confirmation: positive ETCO2, CO2 detector and breath sounds checked- equal and bilateral ?Tube secured with: Tape ?Dental Injury: Teeth and Oropharynx as per pre-operative assessment  ? ? ? ? ?

## 2021-06-25 NOTE — Transfer of Care (Signed)
Immediate Anesthesia Transfer of Care Note ? ?Patient: Kainon Karl ? ?Procedure(s) Performed: CIRCUMCISION ADULT (Penis) ? ?Patient Location: PACU ? ?Anesthesia Type:General ? ?Level of Consciousness: drowsy and patient cooperative ? ?Airway & Oxygen Therapy: Patient Spontanous Breathing and Patient connected to nasal cannula oxygen ? ?Post-op Assessment: Report given to RN and Post -op Vital signs reviewed and stable ? ?Post vital signs: Reviewed and stable ? ?Last Vitals:  ?Vitals Value Taken Time  ?BP 129/77 06/25/21 1006  ?Temp    ?Pulse 61 06/25/21 1008  ?Resp 12 06/25/21 1008  ?SpO2 94 % 06/25/21 1008  ?Vitals shown include unvalidated device data. ? ?Last Pain:  ?Vitals:  ? 06/25/21 0827  ?TempSrc: Oral  ?PainSc: 0-No pain  ?   ? ?Patients Stated Pain Goal: 5 (06/25/21 0827) ? ?Complications: No notable events documented. ?

## 2021-06-26 ENCOUNTER — Encounter (HOSPITAL_BASED_OUTPATIENT_CLINIC_OR_DEPARTMENT_OTHER): Payer: Self-pay | Admitting: Urology

## 2021-06-26 LAB — SURGICAL PATHOLOGY

## 2021-10-06 ENCOUNTER — Ambulatory Visit (HOSPITAL_COMMUNITY)
Admission: EM | Admit: 2021-10-06 | Discharge: 2021-10-06 | Disposition: A | Discharge status: Home / Self Care | Payer: BC Managed Care – PPO | Attending: Family Medicine | Admitting: Family Medicine

## 2021-10-06 ENCOUNTER — Encounter (HOSPITAL_COMMUNITY): Payer: Self-pay

## 2021-10-06 ENCOUNTER — Ambulatory Visit (INDEPENDENT_AMBULATORY_CARE_PROVIDER_SITE_OTHER): Payer: BC Managed Care – PPO

## 2021-10-06 DIAGNOSIS — M5442 Lumbago with sciatica, left side: Secondary | ICD-10-CM | POA: Diagnosis not present

## 2021-10-06 DIAGNOSIS — M545 Low back pain, unspecified: Secondary | ICD-10-CM

## 2021-10-06 MED ORDER — KETOROLAC TROMETHAMINE 30 MG/ML IJ SOLN
30.0000 mg | Freq: Once | INTRAMUSCULAR | Status: AC
  Administered 2021-10-06: 30 mg via INTRAMUSCULAR

## 2021-10-06 MED ORDER — NAPROXEN 500 MG PO TABS: 500.0000 mg | ORAL_TABLET | Freq: Two times a day (BID) | ORAL | 0 refills | Status: DC | PRN

## 2021-10-06 MED ORDER — TIZANIDINE HCL 4 MG PO TABS: 4.0000 mg | ORAL_TABLET | Freq: Three times a day (TID) | ORAL | 0 refills | Status: DC | PRN

## 2021-10-06 MED ORDER — KETOROLAC TROMETHAMINE 30 MG/ML IJ SOLN
INTRAMUSCULAR | Status: AC
  Filled 2021-10-06: qty 1

## 2021-10-06 NOTE — ED Triage Notes (Signed)
Pt states left lower back pain radiating down left leg for the past 3 days. Pt denies injury. Has been taking Motrin at home with no relief.
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
## 2021-10-06 NOTE — Discharge Instructions (Addendum)
Your x-rays showed some arthritic/degenerative changes.  You have been given a shot of Toradol 30 mg today.  Take naproxen 500 mg--1 tablet every 12 hours as needed for pain   Take tizanidine 4 mg--1 every 8 hours as needed for muscle spasms  When you get home make an appointment with your primary care to follow-up for this pain.  They may need to do other imaging or orders for physical therapy

## 2021-10-21 ENCOUNTER — Encounter (HOSPITAL_COMMUNITY): Payer: Self-pay

## 2021-10-21 ENCOUNTER — Ambulatory Visit (HOSPITAL_COMMUNITY)
Admission: EM | Admit: 2021-10-21 | Discharge: 2021-10-21 | Disposition: A | Discharge status: Home / Self Care | Payer: BC Managed Care – PPO | Attending: Emergency Medicine | Admitting: Emergency Medicine

## 2021-10-21 DIAGNOSIS — M5432 Sciatica, left side: Secondary | ICD-10-CM | POA: Diagnosis not present

## 2021-10-21 DIAGNOSIS — M5442 Lumbago with sciatica, left side: Secondary | ICD-10-CM

## 2021-10-21 MED ORDER — KETOROLAC TROMETHAMINE 60 MG/2ML IM SOLN
60.0000 mg | Freq: Once | INTRAMUSCULAR | Status: AC
  Administered 2021-10-21: 60 mg via INTRAMUSCULAR

## 2021-10-21 MED ORDER — KETOROLAC TROMETHAMINE 60 MG/2ML IM SOLN
INTRAMUSCULAR | Status: AC
Start: 2021-10-21 — End: ?
  Filled 2021-10-21: qty 2

## 2021-10-21 MED ORDER — LIDOCAINE 5 % EX PTCH: 1.0000 | MEDICATED_PATCH | Freq: Two times a day (BID) | CUTANEOUS | 0 refills | Status: DC

## 2021-10-21 NOTE — ED Triage Notes (Signed)
Pt is here follow up back pain, pt states nothing is helping his pain

## 2021-10-21 NOTE — Discharge Instructions (Addendum)
Try the lidocaine patches, one every 12 hours as needed. You can place them on the left lower back where you have pain. We have given you a Toradol injection today. Starting tomorrow, you can continue the ibuprofen 800 mg every 6 hours for pain until you can follow up with the orthopedic specialists. Their contact information is below.  With any worsening symptoms, please go to the emergency department.
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
## 2021-10-28 IMAGING — CT CT CHEST W/ CM
2 of 3 series · 15 of 36 positions shown, 18 images · IV contrast (Omni 300)
Comparison: Chest x-ray 03/11/2020

CLINICAL DATA: Abnormal chest x-ray chest pain

EXAM:
CT CHEST WITH CONTRAST
TECHNIQUE: Multidetector CT imaging of the chest was performed during
intravenous contrast administration.
CONTRAST:  75mL OMNIPAQUE IOHEXOL 300 MG/ML  SOLN

[Series 3: chest with 2mm st · axial · 0.98mm/px · z∈[+1271,+1539]mm · 12 of 158 slices shown, 15 images]
[im 12/158  mediastinal]
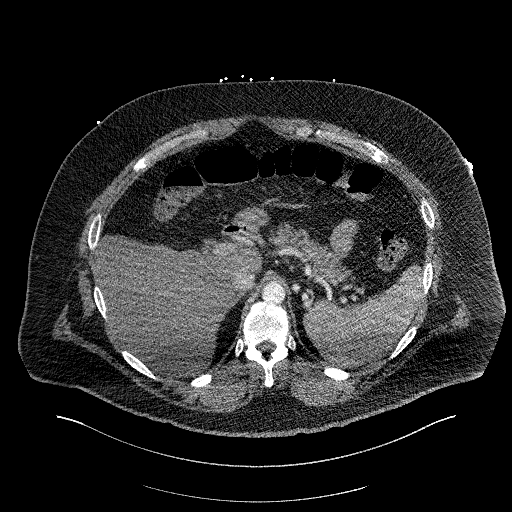
[im 12/158  lung]
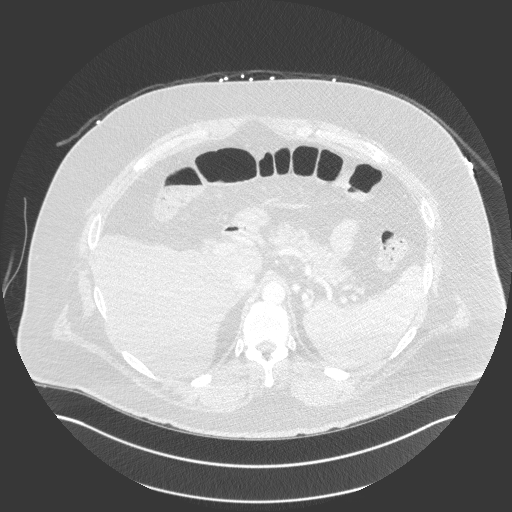
[im 24/158  lung]
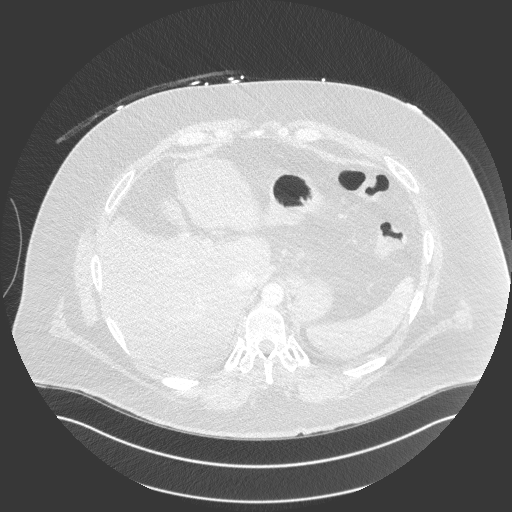
[im 35/158  lung]
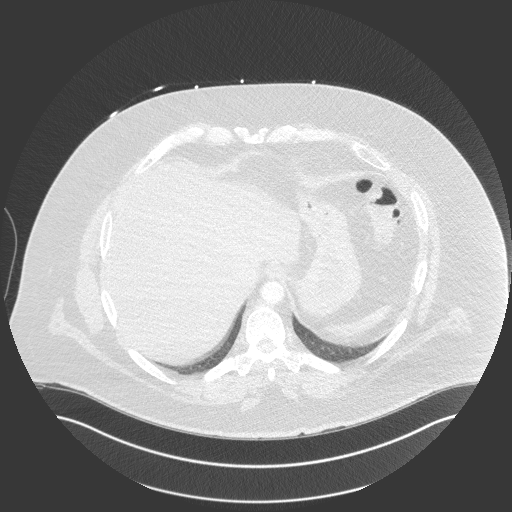
[im 47/158  lung]
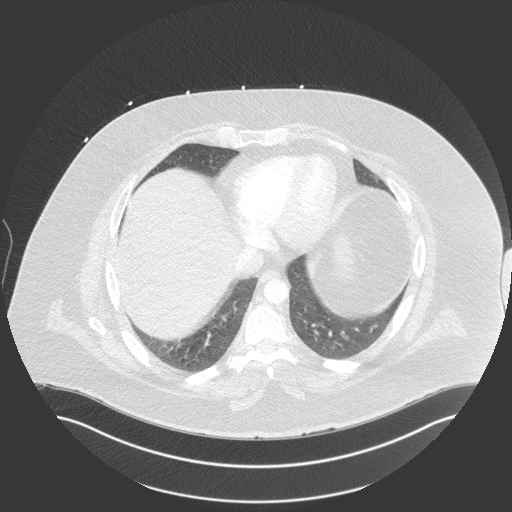
[im 59/158  mediastinal]
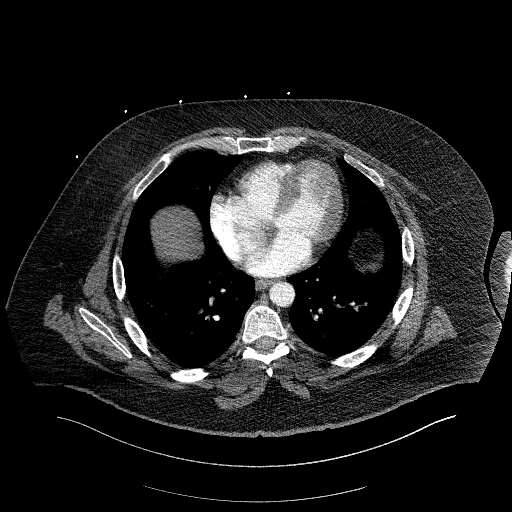
[im 59/158  lung]
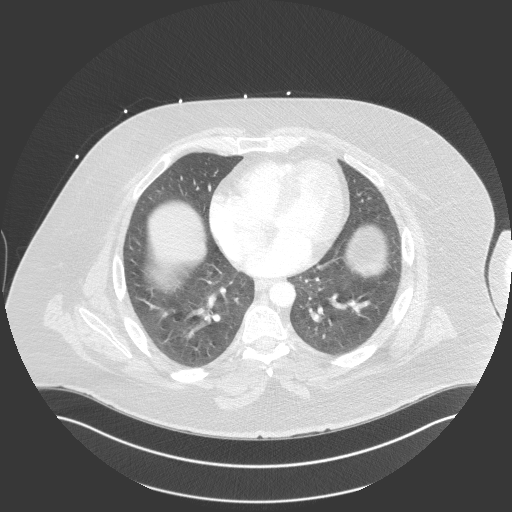
[im 70/158  lung]
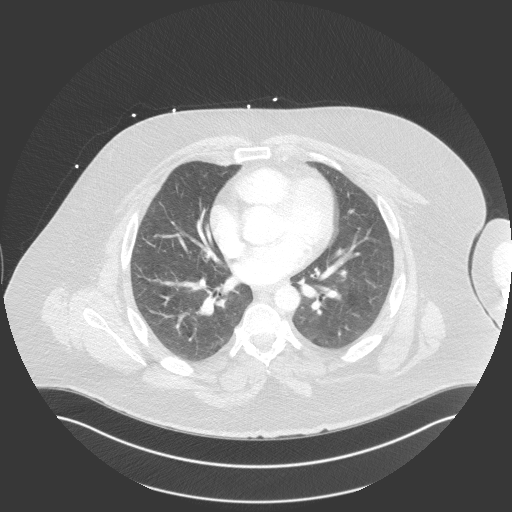
[im 88/158  lung]
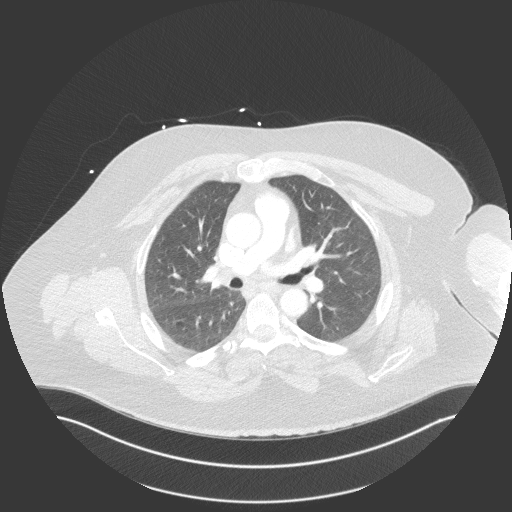
[im 99/158  lung]
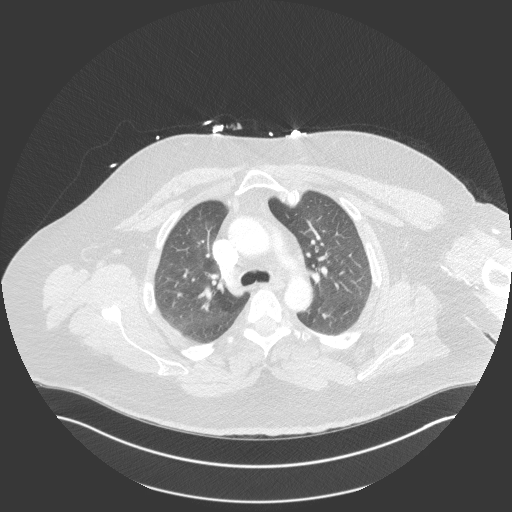
[im 111/158  mediastinal]
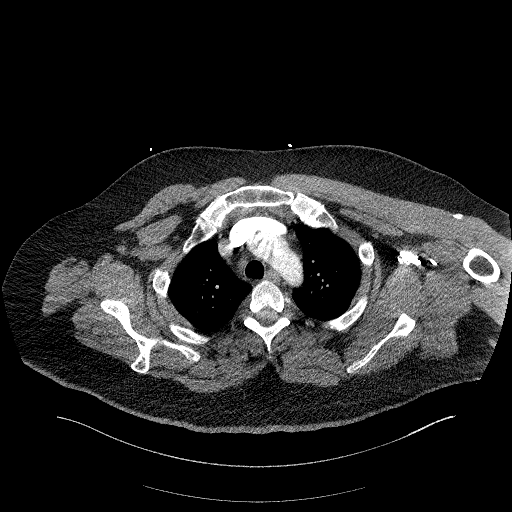
[im 111/158  lung]
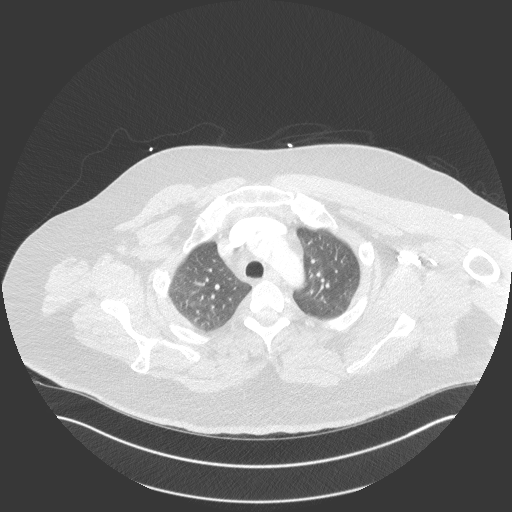
[im 123/158  lung]
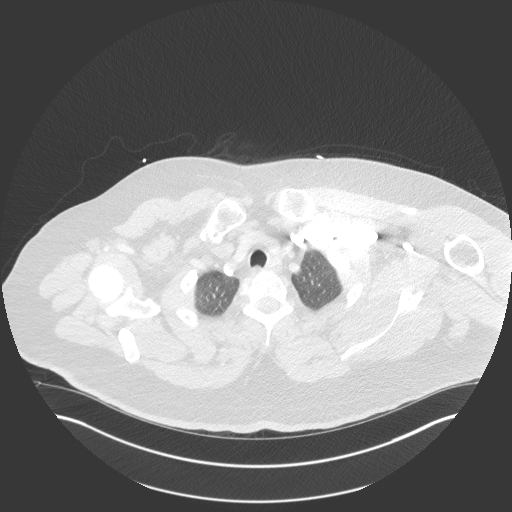
[im 134/158  lung]
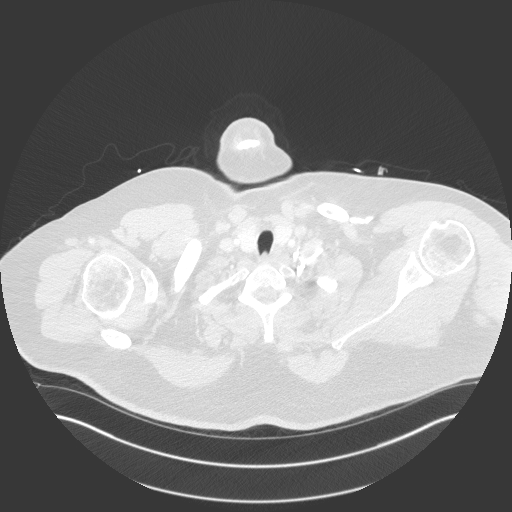
[im 146/158  lung]
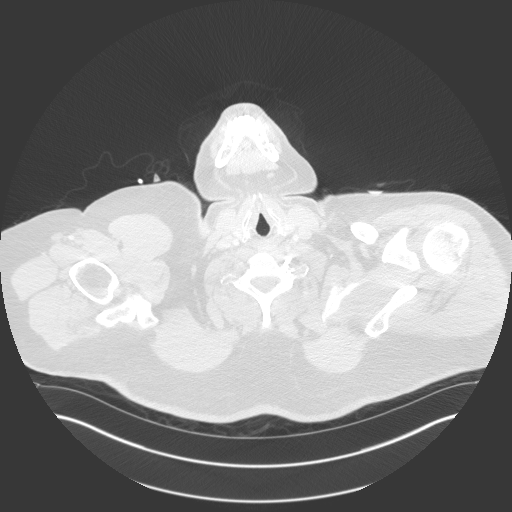

[Series 6: chest with 2mm st cor · coronal · 0.63mm/px · 3 of 161 slices shown]
[im 33/161  lung]
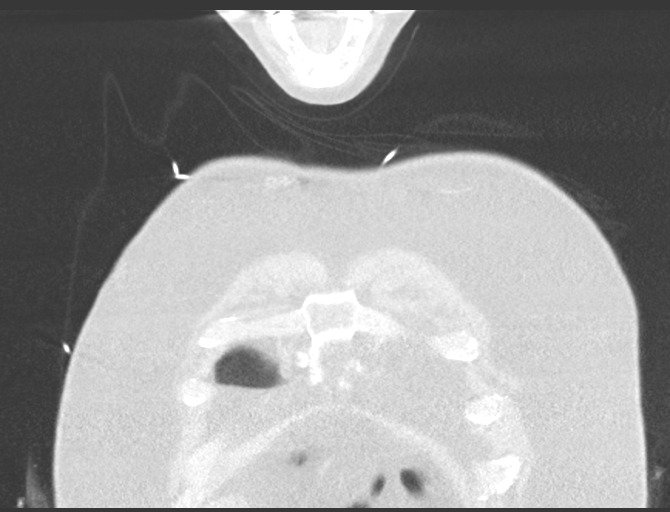
[im 65/161  lung]
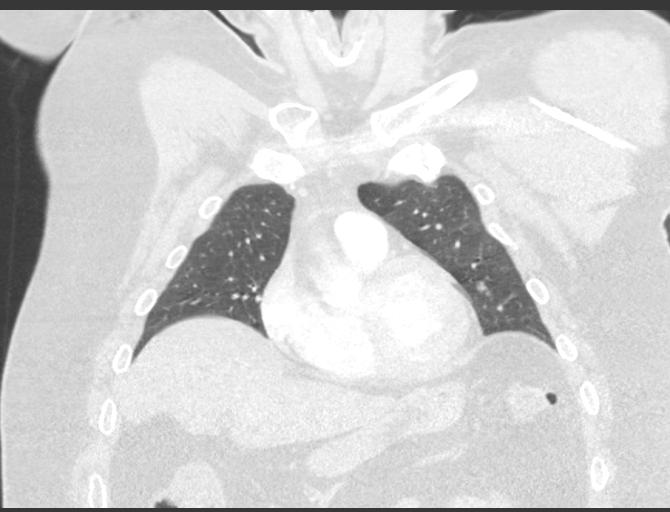
[im 97/161  lung]
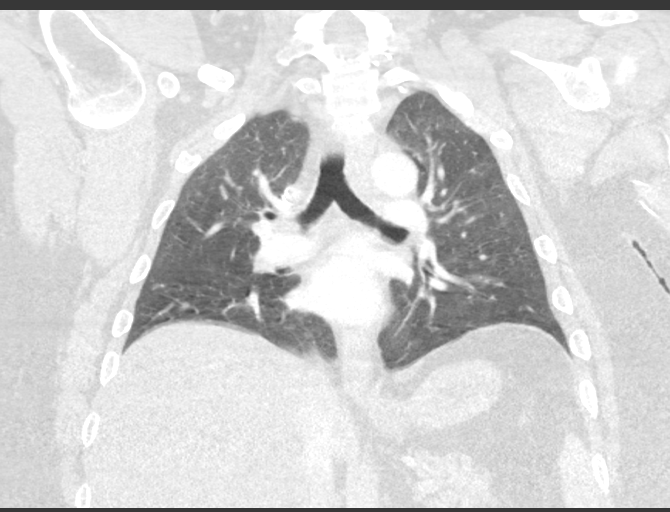

[15 of 36 positions shown; findings below may reference images not displayed]

FINDINGS: Cardiovascular: Nonaneurysmal aorta. Normal heart size. No
pericardial effusion

Mediastinum/Nodes: No enlarged mediastinal, hilar, or axillary lymph
nodes. Thyroid gland, trachea, and esophagus demonstrate no
significant findings.

Lungs/Pleura: Lungs are clear. No pleural effusion or pneumothorax.

Upper Abdomen: No acute abnormality.

Musculoskeletal: No chest wall abnormality. No acute or significant
osseous findings.
IMPRESSION: Negative. No CT evidence for acute intrathoracic abnormality.
Negative for pulmonary nodule. Findings on recent chest radiography
felt secondary to prominent left first rib articulation with the
sternum.

## 2021-10-28 IMAGING — CR DG CHEST 2V
2 series · 2 of 2 positions shown · non-contrast
Comparison: 07/18/2019

CLINICAL DATA: Chest pain

EXAM:
CHEST - 2 VIEW

[chest lat]
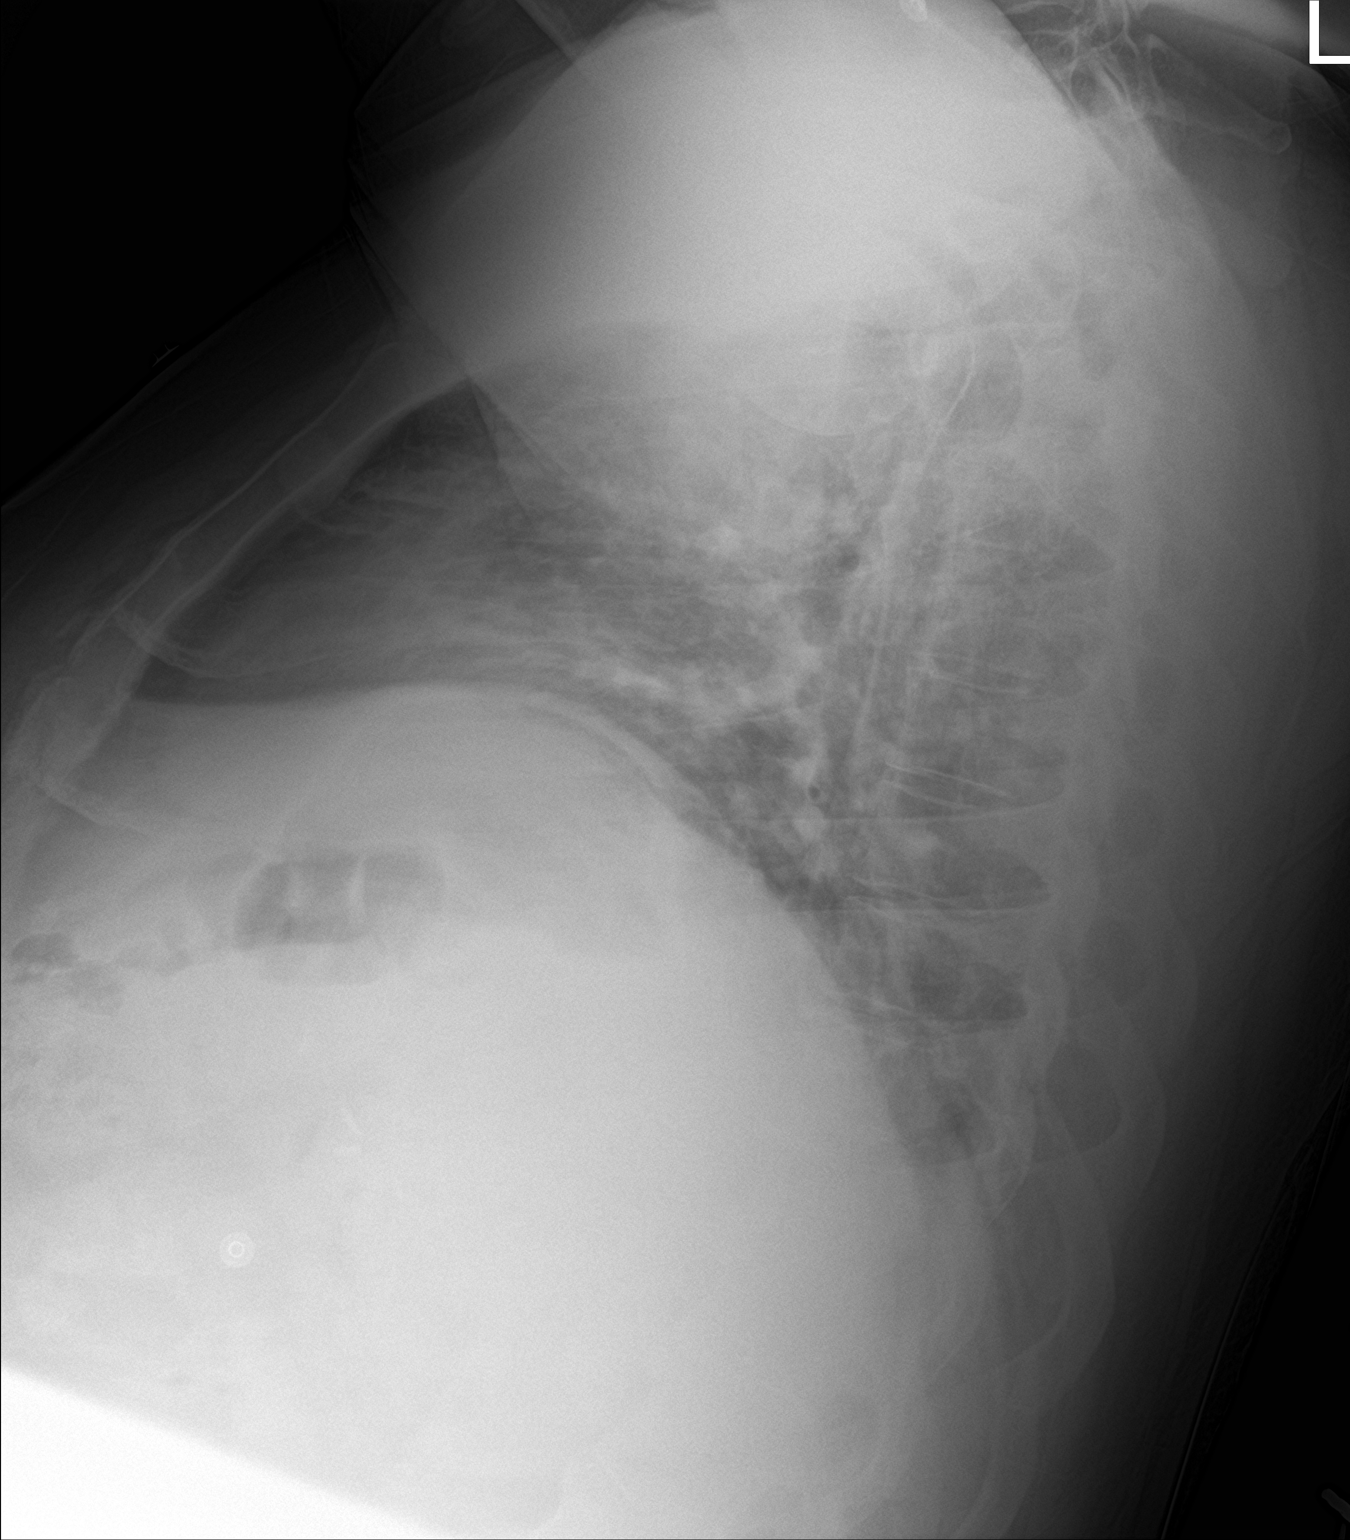

[chest ap]
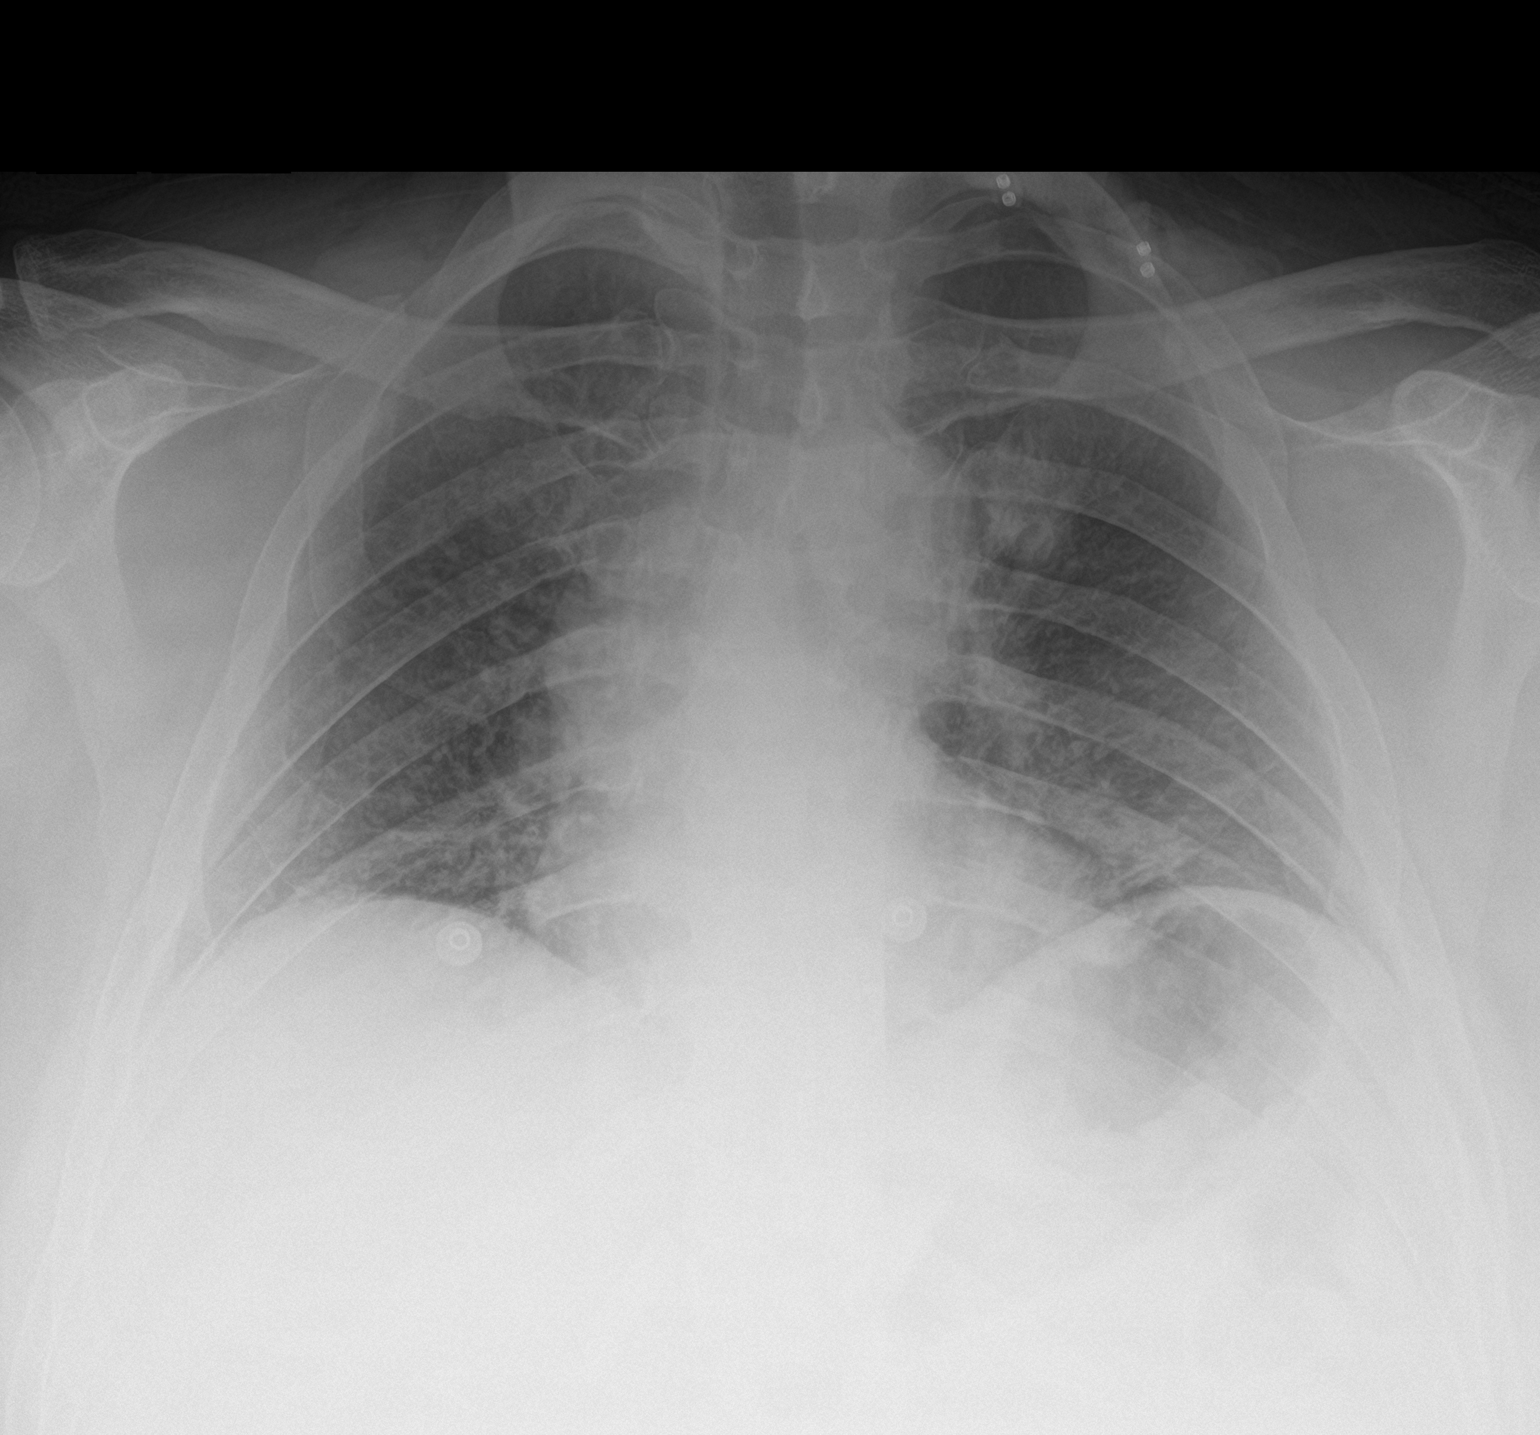

[2 of 2 positions shown; findings below may reference images not displayed]

FINDINGS: The heart size and mediastinal contours are within normal limits.
Mild, diffuse bilateral interstitial opacity, likely with some left
basilar atelectasis. There is a rounded opacity of the suprahilar
left lung, which appears somewhat increased compared to prior
examination dated 07/18/2019 although projects below the left first
rib end. The visualized skeletal structures are unremarkable.
IMPRESSION: 1. Mild, diffuse bilateral interstitial opacity, likely with some
left basilar atelectasis. Findings are most consistent with
pulmonary edema.
2. There is a rounded opacity of the suprahilar left lung, which
appears somewhat increased compared to prior examination dated
07/18/2019 although projects below the left first rib end. This is
concerning for a pulmonary mass or nodule. Recommend CT to further
evaluate, not necessarily on an emergent basis.

## 2021-11-06 DIAGNOSIS — M545 Low back pain, unspecified: Secondary | ICD-10-CM | POA: Insufficient documentation

## 2021-11-13 DIAGNOSIS — M5416 Radiculopathy, lumbar region: Secondary | ICD-10-CM | POA: Insufficient documentation

## 2021-11-14 DIAGNOSIS — M47819 Spondylosis without myelopathy or radiculopathy, site unspecified: Secondary | ICD-10-CM | POA: Insufficient documentation

## 2021-12-04 DIAGNOSIS — M51369 Other intervertebral disc degeneration, lumbar region without mention of lumbar back pain or lower extremity pain: Secondary | ICD-10-CM | POA: Insufficient documentation

## 2022-08-03 ENCOUNTER — Emergency Department (HOSPITAL_COMMUNITY): Payer: BC Managed Care – PPO

## 2022-08-03 ENCOUNTER — Other Ambulatory Visit: Payer: Self-pay

## 2022-08-03 ENCOUNTER — Emergency Department (HOSPITAL_COMMUNITY)
Admission: EM | Admit: 2022-08-03 | Discharge: 2022-08-03 | Disposition: A | Discharge status: Home / Self Care | Payer: BC Managed Care – PPO | Attending: Emergency Medicine | Admitting: Emergency Medicine

## 2022-08-03 ENCOUNTER — Encounter (HOSPITAL_COMMUNITY): Payer: Self-pay

## 2022-08-03 DIAGNOSIS — Z79899 Other long term (current) drug therapy: Secondary | ICD-10-CM | POA: Insufficient documentation

## 2022-08-03 DIAGNOSIS — R0789 Other chest pain: Secondary | ICD-10-CM | POA: Insufficient documentation

## 2022-08-03 DIAGNOSIS — I10 Essential (primary) hypertension: Secondary | ICD-10-CM | POA: Diagnosis not present

## 2022-08-03 DIAGNOSIS — R079 Chest pain, unspecified: Secondary | ICD-10-CM | POA: Diagnosis present

## 2022-08-03 LAB — BASIC METABOLIC PANEL
Anion gap: 8 (ref 5–15)
BUN: 18 mg/dL (ref 6–20)
CO2: 25 mmol/L (ref 22–32)
Calcium: 9 mg/dL (ref 8.9–10.3)
Chloride: 108 mmol/L (ref 98–111)
Creatinine, Ser: 0.91 mg/dL (ref 0.61–1.24)
GFR, Estimated: >60 mL/min (ref >60)
Glucose, Bld: 95 mg/dL (ref 70–99)
Potassium: 3.8 mmol/L (ref 3.5–5.1)
Sodium: 141 mmol/L (ref 135–145)

## 2022-08-03 LAB — CBC
HCT: 42.1 % (ref 39.0–52.0)
Hemoglobin: 14.6 g/dL (ref 13.0–17.0)
MCH: 31.4 pg (ref 26.0–34.0)
MCHC: 34.7 g/dL (ref 30.0–36.0)
MCV: 90.5 fL (ref 80.0–100.0)
Platelets: 156 10*3/uL (ref 150–400)
RBC: 4.65 MIL/uL (ref 4.22–5.81)
RDW: 11.9 % (ref 11.5–15.5)
WBC: 4.8 10*3/uL (ref 4.0–10.5)
nRBC: 0 % (ref 0.0–0.2)

## 2022-08-03 LAB — TROPONIN I (HIGH SENSITIVITY)
Troponin I (High Sensitivity): 3 ng/L (ref <18)
Troponin I (High Sensitivity): 3 ng/L (ref <18)

## 2022-08-03 LAB — D-DIMER, QUANTITATIVE: D-Dimer, Quant: <0.27 ug/mL-FEU (ref 0.00–0.50)

## 2022-08-03 MED ORDER — IOHEXOL 350 MG/ML SOLN
100.0000 mL | Freq: Once | INTRAVENOUS | Status: AC | PRN
  Administered 2022-08-03: 100 mL via INTRAVENOUS

## 2022-08-03 MED ORDER — ASPIRIN 81 MG PO CHEW
324.0000 mg | CHEWABLE_TABLET | Freq: Once | ORAL | Status: AC
  Administered 2022-08-03: 324 mg via ORAL
  Filled 2022-08-03: qty 4

## 2022-08-03 NOTE — ED Provider Notes (Signed)
[place] [facility]MENT AT [facility] Provider Note   CSN: [id number] Arrival date & time: 08/03/22  4098     History  Chief Complaint  Patient presents with   Chest Pain   Numbness    Greg Velez is a 47 y.o. male with a past medical history significant for hypertension, hyperlipidemia, vitamin D deficiency, congenital deafness, GERD, ascending aortic dilation who presents to the ED due to chest pain that started around 8 PM last night.  Chest pain is constant and sharp in nature.  He also endorses numbness/tingling to left upper extremity.  Chest pain associated with diaphoresis and shortness of breath.  Also endorses pain in his right rib location.  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  Denies history of blood clots, recent surgeries, recent long immobilizations, or hormonal treatments.  Patient notes he has had this chest pain in the past roughly 2 years ago where he was evaluated by cardiology who believed his chest pain was secondary to hypertension per patient. He has been compliant with his BP medications.  Per chart review patient had an echocardiogram in 2022 with EF 55-60. Upper limit of normal aorta. Stress test in 2019 normal.  History obtained from patient and past medical records. Official ASL interpreter used. Advised RN to call in an in person interpreter for patient.       Home Medications Prior to Admission medications   Medication Sig Start Date End Date Taking? Authorizing Provider  amLODipine (NORVASC) 5 MG tablet Take 1 tablet (5 mg total) by mouth daily. Patient taking differently: Take 5 mg by mouth daily. 07/04/20   Meriam Sprague, MD  atorvastatin (LIPITOR) 10 MG tablet Take 1 tablet (10 mg total) by mouth daily. Patient taking differently: Take 10 mg by mouth at bedtime. 07/04/20   Meriam Sprague, MD  carvedilol (COREG) 6.25 MG tablet Take 1 tablet (6.25 mg total) by mouth 2 (two) times daily. Patient taking  differently: Take 6.25 mg by mouth 2 (two) times daily. 07/04/20   Meriam Sprague, MD  lidocaine (LIDODERM) 5 % Place 1 patch onto the skin every 12 (twelve) hours. 10/21/21   Rising, Lurena Joiner, PA-C  losartan (COZAAR) 100 MG tablet Take 1 tablet (100 mg total) by mouth daily. Patient taking differently: Take 1 tablet by mouth daily. 07/04/20   Meriam Sprague, MD  omeprazole (PRILOSEC) 40 MG capsule Take 1 capsule (40 mg total) by mouth daily. Patient taking differently: Take 40 mg by mouth daily. 05/09/20   Meriam Sprague, MD      Allergies    Patient has no known allergies.    Review of Systems   Review of Systems  Constitutional:  Positive for diaphoresis. Negative for chills and fever.  Respiratory:  Positive for shortness of breath.   Cardiovascular:  Positive for chest pain. Negative for leg swelling.  Gastrointestinal:  Negative for abdominal pain.    Physical Exam Updated Vital Signs BP 134/78   Pulse (!) 50   Temp 98 F (36.7 C) (Oral)   Resp 11   SpO2 98%  Physical Exam Vitals and nursing note reviewed.  Constitutional:      General: He is not in acute distress.    Appearance: He is not ill-appearing.  HENT:     Head: Normocephalic.  Eyes:     Pupils: Pupils are equal, round, and reactive to light.  Cardiovascular:     Rate and Rhythm: Normal rate and regular rhythm.  Pulses: Normal pulses.     Heart sounds: Normal heart sounds. No murmur heard.    No friction rub. No gallop.  Pulmonary:     Effort: Pulmonary effort is normal.     Breath sounds: Normal breath sounds.  Chest:       Comments: Reproducible tenderness to anterior chest wall.  No crepitus or deformity. Abdominal:     General: Abdomen is flat. There is no distension.     Palpations: Abdomen is soft.     Tenderness: There is no abdominal tenderness. There is no guarding or rebound.  Musculoskeletal:        General: Normal range of motion.     Cervical back: Neck supple.   Skin:    General: Skin is warm and dry.  Neurological:     General: No focal deficit present.     Mental Status: He is alert.  Psychiatric:        Mood and Affect: Mood normal.        Behavior: Behavior normal.     ED Results / Procedures / Treatments   Labs (all labs ordered are listed, but only abnormal results are displayed) Labs Reviewed  BASIC METABOLIC PANEL  CBC  D-DIMER, QUANTITATIVE  TROPONIN I (HIGH SENSITIVITY)  TROPONIN I (HIGH SENSITIVITY)    EKG EKG Interpretation  Date/Time:  Tuesday August 03 2022 06:17:17 EDT Ventricular Rate:  58 PR Interval:  143 QRS Duration: 105 QT Interval:  423 QTC Calculation: 416 R Axis:   76 Text Interpretation: Sinus rhythm TW inversion III new compared to prior Confirmed by Alvira Monday ([id number]) on 08/03/2022 9:46:53 AM  Radiology CT Angio Chest/Abd/Pel for Dissection W and/or Wo Contrast  Result Date: 08/03/2022 CLINICAL DATA:  Acute onset appears strapping chest pain EXAM: CT ANGIOGRAPHY CHEST, ABDOMEN AND PELVIS TECHNIQUE: Non-contrast CT of the chest was initially obtained. Multidetector CT imaging through the chest, abdomen and pelvis was performed using the standard protocol during bolus administration of intravenous contrast. Multiplanar reconstructed images and MIPs were obtained and reviewed to evaluate the vascular anatomy. RADIATION DOSE REDUCTION: This exam was performed according to the departmental dose-optimization program which includes automated exposure control, adjustment of the mA and/or kV according to patient size and/or use of iterative reconstruction technique. CONTRAST:  OMNIPAQUE IOHEXOL 350 MG/ML SOLN COMPARISON:  Chest x-ray 03/04/2022 earlier FINDINGS: CTA CHEST FINDINGS Cardiovascular: Heart is nonenlarged. No pericardial effusion. Bovine type aortic arch, normal variant. Mild pulsation artifact along the ascending aorta. No mediastinal hematoma. No dissection or aneurysm formation. Diameter of  the very proximal ascending aorta just above the root has a diameter of 3.2 cm. Mid ascending aorta level of the pulmonary artery has a diameter of 3.1 cm. Descending thoracic aorta same level measures 2.7 cm. Distal aortic arch has a diameter of 2.5 cm. Mediastinum/Nodes: Thyroid gland is preserved. Normal caliber thoracic esophagus. No specific abnormal lymph node enlargement identified in the axillary region, hilum or mediastinum. Lungs/Pleura: Breathing motion. No consolidation, hilum or mediastinum. Minimal patchy ground-glass. Musculoskeletal: Mild degenerative changes along the spine. Review of the MIP images confirms the above findings. CTA ABDOMEN AND PELVIS FINDINGS VASCULAR Aorta: Normal caliber aorta without aneurysm, dissection, vasculitis or significant stenosis. Celiac: Patent without evidence of aneurysm, dissection, vasculitis or significant stenosis. SMA: Patent without evidence of aneurysm, dissection, vasculitis or significant stenosis. Renals: Both renal arteries are patent without evidence of aneurysm, dissection, vasculitis, fibromuscular dysplasia or significant stenosis. IMA: Patent without evidence of aneurysm, dissection, vasculitis or  significant stenosis. Inflow: Patent without evidence of aneurysm, dissection, vasculitis or significant stenosis. Veins: No obvious venous abnormality within the limitations of this arterial phase study. Retroaortic left renal vein. Review of the MIP images confirms the above findings. NON-VASCULAR Hepatobiliary: With the limits of the early phase of this arterial contrast bolus, the liver is grossly preserved. Previous cholecystectomy. Pancreas: Unremarkable. No pancreatic ductal dilatation or surrounding inflammatory changes. Spleen: Normal in size without focal abnormality. Adrenals/Urinary Tract: Adrenal glands are preserved. Punctate nonobstructing left-sided midportion renal stone. There are some tiny low-attenuation lesions along each kidney which  are too small to completely characterize but likely benign cysts, Bosniak 2 lesions. No specific imaging follow-up. The ureters have normal course and caliber down to the bladder. Preserved contours of the urinary bladder. Stomach/Bowel: No oral contrast. Normal appendix in the right lower quadrant. Scattered colonic stool. Few colonic diverticula. The stomach and small bowel are nondilated. Lymphatic: No specific abnormal lymph node enlargement identified in the abdomen and pelvis. Reproductive: Prostate is unremarkable. Other: No free air or free fluid. Musculoskeletal: Scattered degenerative changes. Review of the MIP images confirms the above findings. IMPRESSION: No dissection or aneurysm formation.  Minimal plaque. Nonobstructing left-sided renal stone. Electronically Signed   By: Karen Kays M.D.   On: 08/03/2022 11:56   DG Chest 2 View  Result Date: 08/03/2022 CLINICAL DATA:  47 year old male with history of chest pain. EXAM: CHEST - 2 VIEW COMPARISON:  Chest x-ray 03/11/2020. FINDINGS: Lung volumes are low. No consolidative airspace disease. No pleural effusions. No pneumothorax. Prominence and partial calcification of left first and third costochondral junctions, similar to the prior examination from 2022 (previously evaluated with chest CT 03/11/2020, which demonstrated no underlying pulmonary nodules). No pulmonary nodule or mass noted. Pulmonary vasculature and the cardiomediastinal silhouette are within normal limits. IMPRESSION: 1. Low lung volumes without radiographic evidence of acute cardiopulmonary disease. Electronically Signed   By: Trudie Reed M.D.   On: 08/03/2022 07:13    Procedures Procedures    Medications Ordered in ED Medications  aspirin chewable tablet 324 mg (324 mg Oral Given 08/03/22 0702)  iohexol (OMNIPAQUE) 350 MG/ML injection 100 mL (100 mLs Intravenous Contrast Given 08/03/22 1020)    ED Course/ Medical Decision Making/ A&P                              Medical Decision Making Amount and/or Complexity of Data Reviewed External Data Reviewed: notes.    Details: Cardiology notes reviewed Labs: ordered. Decision-making details documented in ED Course. Radiology: ordered and independent interpretation performed. Decision-making details documented in ED Course.  Risk OTC drugs. Prescription drug management.   This patient presents to the ED for concern of CP, this involves an extensive number of treatment options, and is a complaint that carries with it a high risk of complications and morbidity.  The differential diagnosis includes ACS, PE, Aortic dissection, MSK, etc  47 year old male with a history of hypertension and hyperlipidemia presents to the ED due to sharp, constant chest pain that started at 8 PM last night.  Also endorses shortness of breath, diaphoresis, left upper extremity numbness/tingling.  Patient had a reassuring echocardiogram in 2022 which showed an upper limit of normal aorta.  Normal stress test in 2019.  No history of blood clots.  Upon arrival patient afebrile mildly bradycardic at 56 with otherwise reassuring vitals.  Patient in no acute distress.  Physical exam significant for reproducible  anterior chest wall tenderness.  No crepitus or deformity.  No lower extremity edema.  Cardiac labs ordered.  Chest x-ray to rule out evidence of pneumonia or widened mediastinum.  ASA given. D-dimer to rule out PE.  CBC unremarkable.  No leukocytosis.  Normal hemoglobin.  BMP unremarkable.  Normal renal function.  No major electrolyte derangements.  D-dimer normal.  Low suspicion for PE.  Troponin x 2 normal.  EKG demonstrates normal sinus rhythm.  No signs of acute ischemia.  Low suspicion for ACS.  9:50 AM reassessed patient at bedside. In person ASL interpreter at bedside. CTA dissection study ordered given history of aortic dilation.   Ct dissection negative for dissection or aneurysm.  Minimal plaque.  Does demonstrate  nonobstructing left-sided renal stone.  Patient's labs are reassuring.  Low suspicion for ACS, PE, or aortic dissection.  Possible MSK etiology given reproducible tenderness on exam.  Advised patient to follow-up with his cardiologist this week for further evaluation or return if symptoms worsen. Strict ED precautions discussed with patient. Patient states understanding and agrees to plan. Patient discharged home in no acute distress and stable vitals  Language barrier- ASL, interpreter used during entire encounter Lives at home Hx HTN        Final Clinical Impression(s) / ED Diagnoses Final diagnoses:  Atypical chest pain    Rx / DC Orders ED Discharge Orders          Ordered    Ambulatory referral to Cardiology       Comments: If you have not heard from the Cardiology office within the next 72 hours please call [phone].   08/03/22 1234              Mannie Stabile, PA-C 08/03/22 1234    Alvira Monday, MD 08/03/22 2132

## 2022-08-03 NOTE — ED Notes (Signed)
CT called to check on results of CTA. Ct states they will call imaging center

## 2022-08-03 NOTE — ED Notes (Signed)
Onsite interpreter number called that was in chart x2. No answer from interpreter. Provider notified.

## 2022-08-03 NOTE — ED Notes (Signed)
Pt transported to xray 

## 2022-08-03 NOTE — Discharge Instructions (Signed)
It was a pleasure taking care of you today.  As discussed, all of your labs are reassuring.  Your CT scan did not show evidence of a dissection or aneurysm.  It did show some stones in your left kidney.  Please call your cardiologist today to schedule an appointment for further evaluation.  Return to the ER for new or worsening symptoms.

## 2022-08-03 NOTE — ED Triage Notes (Addendum)
Patient states he started having a piercing sharp pain in his chest yesterday. Patient took 4 (200mg ) Motrin's for the pain. Patient states he also is having numbness in his left arm from his elbow to pinky. States this has never happened before. Patient also had shortness of breath with the chest pain. No blood thinners. Hx of HTN and High cholesterol
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
## 2022-09-02 ENCOUNTER — Ambulatory Visit: Payer: BC Managed Care – PPO | Attending: Adult Health | Admitting: Adult Health

## 2022-09-02 ENCOUNTER — Encounter: Payer: Self-pay | Admitting: Adult Health

## 2022-09-02 VITALS — BP 134/98 | HR 59 | Ht 74.0 in | Wt 278.8 lb

## 2022-09-02 DIAGNOSIS — Z01810 Encounter for preprocedural cardiovascular examination: Secondary | ICD-10-CM | POA: Diagnosis not present

## 2022-09-02 DIAGNOSIS — R079 Chest pain, unspecified: Secondary | ICD-10-CM

## 2022-09-02 NOTE — Patient Instructions (Signed)
Medication Instructions:  No Changes *If you need a refill on your cardiac medications before your next appointment, please call your pharmacy*   Lab Work: BMET Today If you have labs (blood work) drawn today and your tests are completely normal, you will receive your results only by: MyChart Message (if you have MyChart) OR A paper copy in the mail If you have any lab test that is abnormal or we need to change your treatment, we will call you to review the results.   Testing/Procedures:   Your cardiac CT will be scheduled at one of the below locations:   [facility] [address] [place], [place] [address] [phone]   If scheduled at [facility], please arrive at the [facility] and Children's Entrance (Entrance C2) of [facility] 30 minutes prior to test start time. You can use the FREE valet parking offered at entrance C (encouraged to control the heart rate for the test)  Proceed to the [facility] Radiology Department (first floor) to check-in and test prep.  All radiology patients and guests should use entrance C2 at [facility], accessed from [facility], even though the hospital's physical address listed is [address].    If scheduled at [facility] or [facility], please arrive 15 mins early for check-in and test prep. There is spacious parking and easy access to the radiology department from the [facility] Heart and Vascular entrance. Please enter here and check-in with the desk attendant.    Please follow these instructions carefully (unless otherwise directed):  An IV will be required for this test and Nitroglycerin will be given.  Hold all erectile dysfunction medications at least 3 days (72 hrs) prior to test. (Ie viagra, cialis, sildenafil, tadalafil, etc)   On the Night Before the Test: Be sure to Drink plenty of water. Do not consume any  caffeinated/decaffeinated beverages or chocolate 12 hours prior to your test. Do not take any antihistamines 12 hours prior to your test.  On the Day of the Test: Drink plenty of water until 1 hour prior to the test. Do not eat any food 1 hour prior to test. You may take your regular medications prior to the test.  Take metoprolol (Lopressor) two hours prior to test. If you take Furosemide/Hydrochlorothiazide/Spironolactone, please HOLD on the morning of the test.    After the Test: Drink plenty of water. After receiving IV contrast, you may experience a mild flushed feeling. This is normal. On occasion, you may experience a mild rash up to 24 hours after the test. This is not dangerous. If this occurs, you can take Benadryl 25 mg and increase your fluid intake. If you experience trouble breathing, this can be serious. If it is severe call 911 IMMEDIATELY. If it is mild, please call our office. If you take any of these medications: Glipizide/Metformin, Avandament, Glucavance, please do not take 48 hours after completing test unless otherwise instructed.  We will call to schedule your test 2-4 weeks out understanding that some insurance companies will need an authorization prior to the service being performed.   For more information and frequently asked questions, please visit our website : [email or web address]  For non-scheduling related questions, please contact the cardiac imaging nurse navigator should you have any questions/concerns: Greg Velez, Cardiac Imaging Nurse Navigator Greg Velez, Cardiac Imaging Nurse Navigator Greg Velez [facility] V[facility] Services Direct Office Dial: [phone]   For scheduling needs, including cancellations  and rescheduling, please call [place], [phone].    Follow-Up: At [facility], you and your health needs are our priority.  As part of our continuing mission to provide you with exceptional heart care, we have  created designated Provider Care Teams.  These Care Teams include your primary Cardiologist (physician) and Advanced Practice Providers (APPs -  Physician Assistants and Nurse Practitioners) who all work together to provide you with the care you need, when you need it.  We recommend signing up for the patient portal called "MyChart".  Sign up information is provided on this After Visit Summary.  MyChart is used to connect with patients for Virtual Visits (Telemedicine).  Patients are able to view lab/test results, encounter notes, upcoming appointments, etc.  Non-urgent messages can be sent to your provider as well.   To learn more about what you can do with MyChart, go to [email or web address].    Your next appointment:   6 week(s)  Provider:   Joni Reining, DNP, ANP   or  Maisie Fus, MD

## 2022-09-03 LAB — BASIC METABOLIC PANEL
BUN/Creatinine Ratio: 12 (ref 9–20)
BUN: 13 mg/dL (ref 6–24)
CO2: 23 mmol/L (ref 20–29)
Calcium: 9.7 mg/dL (ref 8.7–10.2)
Chloride: 105 mmol/L (ref 96–106)
Creatinine, Ser: 1.08 mg/dL (ref 0.76–1.27)
Glucose: 69 mg/dL — ABNORMAL LOW (ref 70–99)
Potassium: 4.6 mmol/L (ref 3.5–5.2)
Sodium: 144 mmol/L (ref 134–144)
eGFR: 84 mL/min/{1.73_m2} (ref >59)

## 2022-09-13 ENCOUNTER — Encounter (HOSPITAL_COMMUNITY): Payer: Self-pay

## 2022-09-13 ENCOUNTER — Telehealth: Payer: Self-pay

## 2022-09-13 NOTE — Telephone Encounter (Addendum)
Unable to leave voicemail for patient . Letter mailed to patient 09/13/22.----- Message from Joni Reining sent at 09/03/2022  7:13 AM EDT ----- I have reviewed his labs.  They are normal and therefore he can proceed with CTA of coronaries.   KL

## 2022-09-23 ENCOUNTER — Encounter (HOSPITAL_COMMUNITY): Payer: Self-pay

## 2022-09-24 ENCOUNTER — Telehealth (HOSPITAL_COMMUNITY): Payer: Self-pay | Admitting: *Deleted

## 2022-09-24 NOTE — Telephone Encounter (Signed)
Attempted to call patient regarding upcoming cardiac CT appointment. Voicemail not set up.  Larey Brick RN Navigator Cardiac Imaging [facility] Heart and Vascular Services [phone] Office [phone] Cell

## 2022-09-27 ENCOUNTER — Encounter (HOSPITAL_COMMUNITY): Payer: Self-pay

## 2022-09-27 ENCOUNTER — Ambulatory Visit (HOSPITAL_COMMUNITY)
Admission: RE | Admit: 2022-09-27 | Discharge: 2022-09-27 | Disposition: A | Discharge status: Home / Self Care | Payer: BC Managed Care – PPO | Source: Ambulatory Visit | Attending: Adult Health | Admitting: Adult Health

## 2022-09-27 DIAGNOSIS — R072 Precordial pain: Secondary | ICD-10-CM | POA: Diagnosis not present

## 2022-09-27 DIAGNOSIS — R079 Chest pain, unspecified: Secondary | ICD-10-CM | POA: Insufficient documentation

## 2022-09-27 MED ORDER — NITROGLYCERIN 0.4 MG SL SUBL
SUBLINGUAL_TABLET | SUBLINGUAL | Status: AC
  Filled 2022-09-27: qty 2

## 2022-09-27 MED ORDER — IOHEXOL 350 MG/ML SOLN
95.0000 mL | Freq: Once | INTRAVENOUS | Status: AC | PRN
  Administered 2022-09-27: 95 mL via INTRAVENOUS

## 2022-09-27 MED ORDER — NITROGLYCERIN 0.4 MG SL SUBL
0.8000 mg | SUBLINGUAL_TABLET | Freq: Once | SUBLINGUAL | Status: AC
  Administered 2022-09-27: 0.8 mg via SUBLINGUAL

## 2022-09-30 ENCOUNTER — Telehealth: Payer: Self-pay

## 2022-09-30 NOTE — Telephone Encounter (Addendum)
Called patient regarding results. Unable to leave message, voicemail not set up. Letter mailed 09/30/22----- Message from Joni Reining sent at 09/27/2022  1:04 PM EDT ----- I have reviewed his Cardiac CT report.  Calcium score of 5.49, minimal, low amount of CAD.Marland Kitchen See PCP for other causes of chest pain.

## 2022-10-04 ENCOUNTER — Telehealth: Payer: Self-pay

## 2022-10-04 NOTE — Telephone Encounter (Addendum)
Letter mailed to patient regarding results. Letter mailed on 10/04/22----- Message from Joni Reining sent at 10/02/2022  5:05 PM EDT ----- I have read the cardiac CT report.  This shows minimal coronary artery disease. No evidence of significant blockages in the coronary arteries. Very good report.  Continue low cholesterol diet and begin purposeful exercise. Continue medication regimen.
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
## 2022-10-15 ENCOUNTER — Ambulatory Visit: Payer: BC Managed Care – PPO | Admitting: Adult Health

## 2022-10-15 ENCOUNTER — Encounter: Payer: Self-pay | Admitting: Adult Health

## 2022-10-19 ENCOUNTER — Encounter (HOSPITAL_COMMUNITY): Payer: Self-pay

## 2022-10-19 ENCOUNTER — Ambulatory Visit (HOSPITAL_COMMUNITY)
Admission: EM | Admit: 2022-10-19 | Discharge: 2022-10-19 | Disposition: A | Discharge status: Home / Self Care | Payer: BC Managed Care – PPO | Attending: Emergency Medicine | Admitting: Emergency Medicine

## 2022-10-19 DIAGNOSIS — B9789 Other viral agents as the cause of diseases classified elsewhere: Secondary | ICD-10-CM | POA: Insufficient documentation

## 2022-10-19 DIAGNOSIS — J0100 Acute maxillary sinusitis, unspecified: Secondary | ICD-10-CM

## 2022-10-19 DIAGNOSIS — J329 Chronic sinusitis, unspecified: Secondary | ICD-10-CM | POA: Insufficient documentation

## 2022-10-19 DIAGNOSIS — Z20822 Contact with and (suspected) exposure to covid-19: Secondary | ICD-10-CM

## 2022-10-19 LAB — SARS CORONAVIRUS 2 (TAT 6-24 HRS): SARS Coronavirus 2: NEGATIVE

## 2022-10-19 MED ORDER — NAPROXEN 500 MG PO TABS: 500.0000 mg | ORAL_TABLET | Freq: Two times a day (BID) | ORAL | 0 refills | Status: DC

## 2022-10-19 MED ORDER — PREDNISONE 20 MG PO TABS: 40.0000 mg | ORAL_TABLET | Freq: Every day | ORAL | 0 refills | Status: AC

## 2022-10-19 NOTE — Discharge Instructions (Signed)
Your COVID test will be back in 6 to 24 hours.  We will contact you if it comes back positive.  You are not a candidate for Paxlovid as you are overall healthy and are fully vaccinated.  Supportive treatment with Naprosyn, and 1000 mg of Tylenol twice a day.  May take an additional 1000 mg Tylenol 1 more time a day, saline nasal irrigation with a NeilMed sinus rinse and distilled water as often as you want to prevent a secondary bacterial sinus infection, prednisone 40 mg for 5 days for pain and swelling in your sinuses.

## 2022-10-19 NOTE — ED Triage Notes (Signed)
Pt presents with c/o sinus pressure, chills and headache X 2 days.   States his feet have been  swollen.
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
## 2022-11-05 ENCOUNTER — Ambulatory Visit: Payer: BC Managed Care – PPO | Admitting: Student
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
## 2022-12-10 ENCOUNTER — Ambulatory Visit: Payer: BC Managed Care – PPO | Attending: Adult Health | Admitting: Adult Health

## 2022-12-10 ENCOUNTER — Encounter: Payer: Self-pay | Admitting: Adult Health

## 2022-12-10 VITALS — BP 112/78 | HR 57 | Ht 74.0 in | Wt 253.0 lb

## 2022-12-10 DIAGNOSIS — E7800 Pure hypercholesterolemia, unspecified: Secondary | ICD-10-CM

## 2022-12-10 DIAGNOSIS — I10 Essential (primary) hypertension: Secondary | ICD-10-CM | POA: Diagnosis not present

## 2022-12-10 DIAGNOSIS — R0789 Other chest pain: Secondary | ICD-10-CM

## 2022-12-10 NOTE — Addendum Note (Signed)
Addended by: Raelyn Number on: 12/10/2022 08:36 AM   Modules accepted: Orders

## 2022-12-10 NOTE — Patient Instructions (Signed)
Medication Instructions:  NO CHANGES     Lab Work: FASTING LIPID PROFILE    Testing/Procedures: NONE    Follow-Up: DR. [facility]   Your next appointment:   6 month(s)   Provider:   Joni Reining

## 2022-12-11 LAB — LIPID PANEL
Chol/HDL Ratio: 3 {ratio} (ref 0.0–5.0)
Cholesterol, Total: 130 mg/dL (ref 100–199)
HDL: 43 mg/dL (ref >39)
LDL Chol Calc (NIH): 72 mg/dL (ref 0–99)
Triglycerides: 77 mg/dL (ref 0–149)
VLDL Cholesterol Cal: 15 mg/dL (ref 5–40)

## 2022-12-16 ENCOUNTER — Telehealth: Payer: Self-pay

## 2022-12-16 NOTE — Telephone Encounter (Addendum)
Letter mailed to patient on 12/16/22 ----- Message from Joni Reining sent at 12/13/2022  7:19 AM EDT ----- Good control of the cholesterol. Continue current management.

## 2022-12-22 ENCOUNTER — Emergency Department (HOSPITAL_COMMUNITY)
Admission: EM | Admit: 2022-12-22 | Discharge: 2022-12-22 | Disposition: A | Discharge status: Home / Self Care | Payer: BC Managed Care – PPO | Attending: Emergency Medicine | Admitting: Emergency Medicine

## 2022-12-22 ENCOUNTER — Other Ambulatory Visit: Payer: Self-pay

## 2022-12-22 ENCOUNTER — Emergency Department (HOSPITAL_COMMUNITY): Payer: BC Managed Care – PPO

## 2022-12-22 ENCOUNTER — Encounter (HOSPITAL_COMMUNITY): Payer: Self-pay | Admitting: Emergency Medicine

## 2022-12-22 DIAGNOSIS — I10 Essential (primary) hypertension: Secondary | ICD-10-CM | POA: Diagnosis not present

## 2022-12-22 DIAGNOSIS — R109 Unspecified abdominal pain: Secondary | ICD-10-CM | POA: Insufficient documentation

## 2022-12-22 DIAGNOSIS — R0781 Pleurodynia: Secondary | ICD-10-CM | POA: Insufficient documentation

## 2022-12-22 DIAGNOSIS — F840 Autistic disorder: Secondary | ICD-10-CM | POA: Diagnosis not present

## 2022-12-22 DIAGNOSIS — Z79899 Other long term (current) drug therapy: Secondary | ICD-10-CM | POA: Insufficient documentation

## 2022-12-22 DIAGNOSIS — J449 Chronic obstructive pulmonary disease, unspecified: Secondary | ICD-10-CM | POA: Diagnosis not present

## 2022-12-22 LAB — HEPATIC FUNCTION PANEL
ALT: 11 U/L (ref 0–44)
AST: 22 U/L (ref 15–41)
Albumin: 4.3 g/dL (ref 3.5–5.0)
Alkaline Phosphatase: 72 U/L (ref 38–126)
Bilirubin, Direct: 0.2 mg/dL (ref 0.0–0.2)
Indirect Bilirubin: 0.9 mg/dL (ref 0.3–0.9)
Total Bilirubin: 1.1 mg/dL (ref 0.3–1.2)
Total Protein: 6.6 g/dL (ref 6.5–8.1)

## 2022-12-22 LAB — BASIC METABOLIC PANEL
Anion gap: 6 (ref 5–15)
BUN: 15 mg/dL (ref 6–20)
CO2: 26 mmol/L (ref 22–32)
Calcium: 9 mg/dL (ref 8.9–10.3)
Chloride: 109 mmol/L (ref 98–111)
Creatinine, Ser: 0.76 mg/dL (ref 0.61–1.24)
GFR, Estimated: >60 mL/min (ref >60)
Glucose, Bld: 118 mg/dL — ABNORMAL HIGH (ref 70–99)
Potassium: 3.6 mmol/L (ref 3.5–5.1)
Sodium: 141 mmol/L (ref 135–145)

## 2022-12-22 LAB — URINALYSIS, ROUTINE W REFLEX MICROSCOPIC
Bilirubin Urine: NEGATIVE
Glucose, UA: NEGATIVE mg/dL
Hgb urine dipstick: NEGATIVE
Ketones, ur: NEGATIVE mg/dL
Leukocytes,Ua: NEGATIVE
Nitrite: NEGATIVE
Protein, ur: NEGATIVE mg/dL
Specific Gravity, Urine: 1.015 (ref 1.005–1.030)
pH: 6 (ref 5.0–8.0)

## 2022-12-22 LAB — LIPASE, BLOOD: Lipase: 33 U/L (ref 11–51)

## 2022-12-22 LAB — CBC
HCT: 39.5 % (ref 39.0–52.0)
Hemoglobin: 13.9 g/dL (ref 13.0–17.0)
MCH: 32.3 pg (ref 26.0–34.0)
MCHC: 35.2 g/dL (ref 30.0–36.0)
MCV: 91.9 fL (ref 80.0–100.0)
Platelets: 145 10*3/uL — ABNORMAL LOW (ref 150–400)
RBC: 4.3 MIL/uL (ref 4.22–5.81)
RDW: 11.9 % (ref 11.5–15.5)
WBC: 4.2 10*3/uL (ref 4.0–10.5)
nRBC: 0 % (ref 0.0–0.2)

## 2022-12-22 LAB — TROPONIN I (HIGH SENSITIVITY)
Troponin I (High Sensitivity): 2 ng/L (ref <18)
Troponin I (High Sensitivity): 2 ng/L (ref <18)

## 2022-12-22 MED ORDER — ALUM & MAG HYDROXIDE-SIMETH 200-200-20 MG/5ML PO SUSP
30.0000 mL | Freq: Once | ORAL | Status: AC
  Administered 2022-12-22: 30 mL via ORAL
  Filled 2022-12-22: qty 30

## 2022-12-22 MED ORDER — LIDOCAINE VISCOUS HCL 2 % MT SOLN
15.0000 mL | Freq: Once | OROMUCOSAL | Status: AC
  Administered 2022-12-22: 15 mL via ORAL
  Filled 2022-12-22: qty 15

## 2022-12-22 MED ORDER — ACETAMINOPHEN 325 MG PO TABS
650.0000 mg | ORAL_TABLET | Freq: Once | ORAL | Status: AC
  Administered 2022-12-22: 650 mg via ORAL
  Filled 2022-12-22: qty 2

## 2022-12-22 NOTE — Discharge Instructions (Addendum)
You are seen in the emergency room today for abdominal pain.  I would like you to follow-up with your primary care for today's visit findings in 2 to 3 days.  Please follow-up with primary care in regards to abdominal imaging as we had not obtained results prior to your discharge.  Please continue taking your medication for acid reflux, if you have breakthrough pain I would recommend Tylenol.  Please return to the emergency room with any new or worsening symptoms.

## 2022-12-22 NOTE — ED Triage Notes (Signed)
Using medical interpreter- patient c/o sharp pain to right side of chest onset about 45 mins ago that progressed into a tight feeling. Pain does not radiate.

## 2022-12-22 NOTE — ED Provider Notes (Cosign Needed Addendum)
[place] EMERGENCY DEPARTMENT AT [facility] Provider Note   CSN: [id number] Arrival date & time: 12/22/22  1914     History  Chief Complaint  Patient presents with   Chest Pain    Greg Velez is a 47 y.o. male history of hypertension, COPD, mutism, ascending aortic dilation presented for chest pain that began at 8 AM this morning after he had cereal for breakfast.  Patient states he is having right lower rib pain that hurts when moving but denies abdominal pain, nausea/vomiting.  Patient denies any recent trauma, falls or any heavy lifting or repetitive movements.  Patient is not taking any pain meds for this.  Patient states that hurts to take a deep breath but denies any hemoptysis, recent travel/hospitalization/surgeries, estrogen use, personal history cancer, leg swelling.  Patient denies any nausea vomiting or dysuria.  Patient does note that he has history of kidney stones on the right side but is unsure if this feels similar to previous but denies any dysuria or hematuria.  Last echo: Aorta was upper limit of normal caliber, LVEF 55 to 60%  ASL Translator [phone]  Home Medications Prior to Admission medications   Medication Sig Start Date End Date Taking? Authorizing Provider  amLODipine (NORVASC) 5 MG tablet Take 1 tablet (5 mg total) by mouth daily. Patient taking differently: Take 5 mg by mouth daily. 07/04/20   Meriam Sprague, MD  atorvastatin (LIPITOR) 10 MG tablet Take 1 tablet (10 mg total) by mouth daily. Patient taking differently: Take 10 mg by mouth at bedtime. 07/04/20   Meriam Sprague, MD  carvedilol (COREG) 6.25 MG tablet Take 1 tablet (6.25 mg total) by mouth 2 (two) times daily. Patient taking differently: Take 6.25 mg by mouth 2 (two) times daily. 07/04/20   Meriam Sprague, MD  losartan (COZAAR) 100 MG tablet Take 1 tablet (100 mg total) by mouth daily. Patient taking differently: Take 1 tablet by mouth daily. 07/04/20   Meriam Sprague, MD  naproxen (NAPROSYN) 500 MG tablet Take 1 tablet (500 mg total) by mouth 2 (two) times daily. 10/19/22   Domenick Gong, MD  omeprazole (PRILOSEC) 40 MG capsule Take 1 capsule (40 mg total) by mouth daily. Patient taking differently: Take 40 mg by mouth daily. 05/09/20   Meriam Sprague, MD  Vitamin D, Ergocalciferol, (DRISDOL) 1.25 MG (50000 UNIT) CAPS capsule Take 50,000 Units by mouth once a week.    [provider]      Allergies    Patient has no known allergies.    Review of Systems   Review of Systems  Cardiovascular:  Positive for chest pain.    Physical Exam Updated Vital Signs BP (!) 162/109   Pulse (!) 47   Temp 98 F (36.7 C)   Resp 15   Ht 6\' 2"  (1.88 m)   Wt 114.8 kg   SpO2 98%   BMI 32.48 kg/m  Physical Exam Vitals reviewed.  Constitutional:      General: He is not in acute distress. HENT:     Head: Normocephalic and atraumatic.  Eyes:     Extraocular Movements: Extraocular movements intact.     Conjunctiva/sclera: Conjunctivae normal.     Pupils: Pupils are equal, round, and reactive to light.  Cardiovascular:     Rate and Rhythm: Normal rate and regular rhythm.     Pulses: Normal pulses.     Heart sounds: Normal heart sounds.     Comments: 2+ bilateral  radial/dorsalis pedis pulses with regular rate Pulmonary:     Effort: Pulmonary effort is normal. No respiratory distress.     Breath sounds: Normal breath sounds.  Abdominal:     Palpations: Abdomen is soft.     Tenderness: There is no abdominal tenderness. There is no right CVA tenderness, left CVA tenderness, guarding or rebound.  Musculoskeletal:        General: Normal range of motion.     Cervical back: Normal range of motion and neck supple.     Comments: 5 out of 5 bilateral grip/leg extension strength When pressing on the right lower ribs able to reproduce patient's pain mildly however there are no bony abnormalities or step-offs  Skin:    General: Skin is warm  and dry.     Capillary Refill: Capillary refill takes less than 2 seconds.  Neurological:     General: No focal deficit present.     Mental Status: He is alert and oriented to person, place, and time.     Comments: Sensation intact in all 4 limbs  Psychiatric:        Mood and Affect: Mood normal.     ED Results / Procedures / Treatments   Labs (all labs ordered are listed, but only abnormal results are displayed) Labs Reviewed  BASIC METABOLIC PANEL - Abnormal; Notable for the following components:      Result Value   Glucose, Bld 118 (*)    All other components within normal limits  CBC - Abnormal; Notable for the following components:   Platelets 145 (*)    All other components within normal limits  LIPASE, BLOOD  HEPATIC FUNCTION PANEL  URINALYSIS, ROUTINE W REFLEX MICROSCOPIC  TROPONIN I (HIGH SENSITIVITY)  TROPONIN I (HIGH SENSITIVITY)    EKG EKG Interpretation Date/Time:  Wednesday December 22 2022 11:35:08 EDT Ventricular Rate:  47 PR Interval:  163 QRS Duration:  102 QT Interval:  435 QTC Calculation: 385 R Axis:   78  Text Interpretation: Sinus bradycardia Since last tracing rate slower Confirmed by Jacalyn Lefevre [phone]) on 12/22/2022 3:07:54 PM  Radiology DG Chest 2 View  Result Date: 12/22/2022 CLINICAL DATA:  Chest pain. EXAM: CHEST - 2 VIEW COMPARISON:  08/03/2022. FINDINGS: Bilateral lung fields are clear. Bilateral costophrenic angles are clear. Normal cardio-mediastinal silhouette. No acute osseous abnormalities. The soft tissues are within normal limits. IMPRESSION: *No active cardiopulmonary disease. Electronically Signed   By: Jules Schick M.D.   On: 12/22/2022 11:18    Procedures Procedures    Medications Ordered in ED Medications  acetaminophen (TYLENOL) tablet 650 mg (650 mg Oral Given 12/22/22 1130)  alum & mag hydroxide-simeth (MAALOX/MYLANTA) 200-200-20 MG/5ML suspension 30 mL (30 mLs Oral Given 12/22/22 1130)    And  lidocaine  (XYLOCAINE) 2 % viscous mouth solution 15 mL (15 mLs Oral Given 12/22/22 1130)    ED Course/ Medical Decision Making/ A&P                                 Medical Decision Making Amount and/or Complexity of Data Reviewed Labs: ordered. Radiology: ordered.  Risk OTC drugs. Prescription drug management.   Greg Velez 48 y.o. presented today for chest pain. Working DDx that I considered at this time includes, but not limited to, ACS, GERD, pulmonary embolism, community-acquired pneumonia, aortic dissection, pneumothorax, underlying bony abnormality, anemia, thyrotoxicosis, esophageal rupture, CHF exacerbation, valvular disorder, myocarditis, pericarditis, endocarditis, pericardial effusion/cardiac  tamponade, pulmonary edema, gastritis/PUD, esophagitis.  R/o Dx: Pending imaging  PE: PERC negative Aortic Dissection: less likely based on the location, quality, onset, and severity of symptoms in this case.  Review of prior external notes: 10/19/2022 ED  Unique Tests and My Interpretation:  EKG: Rate, rhythm, axis, intervals all examined and without medically relevant abnormality. ST segments without concerns for elevations Troponin: 2, 2 UA: Unremarkable BMP: Unremarkable Hepatic function panel: Unremarkable CBC: Unremarkable Lipase: Unremarkable Chest x-ray: Unremarkable CT renal stone study: Pending  Social Determinants of Health: none  Discussion with Independent Historian:  Wife  Discussion of Management of Tests: None  Risk: Medium: prescription drug management  Risk Stratification Score: HEART: 2 PERC: 0  Plan: On exam patient was in no acute distress was noted to be slightly bradycardic however this appears to be his baseline. Patient's physical was remarkable for being will reproduce patient's for right lower rib pain with no abdominal tenderness. Labs and CXR will be ordered. Patient stable at this time.  Patient given Tylenol and GI cocktail.  Patient's labs and  imaging were all reassuring.  I spoke to the patient and we agreed to do CT scan to look for kidney stones patient states that he has had kidney stones in the area and given the location of pain this could also be causing his symptoms.  Patient did improve with the Tylenol and GI cocktail and so do suspect MSK versus gastritis as patient does have history of GERD and this pain occurred after eating however I was also able to reproduce the pain when pressing on his right ribs the patient denies any kind of recent trauma or falls.  Discussed smoking cessation with patient and was they were offerred resources to help stop.  Total time was 5 min CPT code 47829.   Patient signed out to [facility], PA-C.  Please review their note for the continuation of patient's care.  The plan at this point is follow-up on imaging and anticipate discharge if negative.  Patient's pain may be MSK or gastritis related as it did occur after eating however was also able to reproduce it mildly when pressing on his right lower ribs.  Patient stable at this time.   This chart was dictated using voice recognition software.  Despite best efforts to proofread,  errors can occur which can change the documentation meaning.         Final Clinical Impression(s) / ED Diagnoses Final diagnoses:  None    Rx / DC Orders ED Discharge Orders     None         Remi Deter 12/22/22 1511    Rolan Bucco, MD 12/22/22 1536    [name], PA-C 12/22/22 1548    Rolan Bucco, MD 12/23/22 [phone]

## 2022-12-22 NOTE — ED Provider Notes (Signed)
  Physical Exam  BP (!) 162/109   Pulse (!) 47   Temp 98 F (36.7 C)   Resp 15   Ht 6\' 2"  (1.88 m)   Wt 114.8 kg   SpO2 98%   BMI 32.48 kg/m   Physical Exam Cardiovascular:     Rate and Rhythm: Regular rhythm. Bradycardia present.  Pulmonary:     Effort: Pulmonary effort is normal.     Breath sounds: Normal breath sounds.  Abdominal:     General: Abdomen is flat.     Palpations: Abdomen is soft.     Tenderness: There is no abdominal tenderness.  Musculoskeletal:     Right lower leg: No edema.     Left lower leg: No edema.     Procedures  Procedures  ED Course / MDM    Medical Decision Making Amount and/or Complexity of Data Reviewed Labs: ordered. Radiology: ordered.  Risk OTC drugs. Prescription drug management.   Patient was signed off from at shift change. Waiting for scan results, likely okay to go home and follow up OP  Patient overall well-appearing and requesting to leave.  Patient hemodynamically stable.  Pain is under control.  Patient tolerating p.o.  Patient understands that I recommend waiting for imaging result to come back prior to leaving however he would like to go.  I will contact patient if there are abnormal results found on imaging.  Patient understands if abnormal thoughts occur he may need to return to emergency room.  Given the patient understands this I will proceed with discharge.  Follow-up with primary care.  Take Tylenol for pain.  Take PPI as prescribed.  All questions answered.  Return to emergency room with new or worsening symptoms.       Smitty Knudsen, PA-C 12/22/22 Herminio Commons    Jacalyn Lefevre, MD 12/22/22 [phone]

## 2023-01-04 ENCOUNTER — Other Ambulatory Visit: Payer: Self-pay

## 2023-01-04 ENCOUNTER — Ambulatory Visit (HOSPITAL_COMMUNITY)
Admission: EM | Admit: 2023-01-04 | Discharge: 2023-01-04 | Disposition: A | Discharge status: Home / Self Care | Payer: BC Managed Care – PPO | Attending: Family Medicine | Admitting: Family Medicine

## 2023-01-04 ENCOUNTER — Encounter (HOSPITAL_COMMUNITY): Payer: Self-pay | Admitting: Emergency Medicine

## 2023-01-04 DIAGNOSIS — R109 Unspecified abdominal pain: Secondary | ICD-10-CM | POA: Diagnosis not present

## 2023-01-04 DIAGNOSIS — N200 Calculus of kidney: Secondary | ICD-10-CM | POA: Diagnosis not present

## 2023-01-04 LAB — POCT URINALYSIS DIP (MANUAL ENTRY)
Bilirubin, UA: NEGATIVE
Blood, UA: NEGATIVE
Glucose, UA: NEGATIVE mg/dL
Ketones, POC UA: NEGATIVE mg/dL
Leukocytes, UA: NEGATIVE
Nitrite, UA: NEGATIVE
Protein Ur, POC: NEGATIVE mg/dL
Spec Grav, UA: 1.015 (ref 1.010–1.025)
Urobilinogen, UA: 0.2 U/dL
pH, UA: 7 (ref 5.0–8.0)

## 2023-01-04 MED ORDER — TAMSULOSIN HCL 0.4 MG PO CAPS: 0.4000 mg | ORAL_CAPSULE | Freq: Every day | ORAL | 0 refills | Status: DC

## 2023-01-04 MED ORDER — KETOROLAC TROMETHAMINE 30 MG/ML IJ SOLN
INTRAMUSCULAR | Status: AC
  Filled 2023-01-04: qty 1

## 2023-01-04 MED ORDER — KETOROLAC TROMETHAMINE 30 MG/ML IJ SOLN
30.0000 mg | Freq: Once | INTRAMUSCULAR | Status: AC
  Administered 2023-01-04: 30 mg via INTRAMUSCULAR
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
## 2023-01-04 NOTE — ED Triage Notes (Signed)
Using medical interpreter pt c/o kidney pain on his left side hx of kidney stone, having dizziness. No burning sensation or blood on the urine.

## 2023-01-04 NOTE — Discharge Instructions (Addendum)
The Toradol injection given today should continue to work for several hours. Please do not use any NSAIDs (ibuprofen/Advil, naproxen/Aleve, etc) for the next 12 hours. You can safely use tylenol.   Take the Flomax, 1 tablet daily, to help relax ureters and reduce kidney stone pain. Drink lots of fluids!!  Keep appointment with your primary care provider. Please go to the emergency department if symptoms worsen.

## 2023-01-07 DIAGNOSIS — N200 Calculus of kidney: Secondary | ICD-10-CM | POA: Insufficient documentation

## 2023-03-21 ENCOUNTER — Ambulatory Visit (HOSPITAL_COMMUNITY)
Admission: EM | Admit: 2023-03-21 | Discharge: 2023-03-21 | Disposition: A | Discharge status: Home / Self Care | Payer: 59

## 2023-03-21 ENCOUNTER — Encounter (HOSPITAL_COMMUNITY): Payer: Self-pay

## 2023-03-21 DIAGNOSIS — M5430 Sciatica, unspecified side: Secondary | ICD-10-CM | POA: Diagnosis not present

## 2023-03-21 NOTE — Discharge Instructions (Addendum)
You have been diagnosed with sciatic leg pain.  You are encouraged to take ibuprofen 600 mg 3 times daily as needed.  According to your medication you have ibuprofen and along with naproxen on your med list.
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
## 2023-03-21 NOTE — ED Triage Notes (Addendum)
Pt c/o of left/hip pain that occurred today at 1200. It felt like a shock which made his leg give out and it kept happening causing limping.   Home Interventions: None

## 2023-03-24 ENCOUNTER — Encounter (HOSPITAL_COMMUNITY): Payer: Self-pay

## 2023-03-24 ENCOUNTER — Ambulatory Visit (HOSPITAL_COMMUNITY)
Admission: EM | Admit: 2023-03-24 | Discharge: 2023-03-24 | Disposition: A | Discharge status: Home / Self Care | Payer: 59 | Attending: Emergency Medicine | Admitting: Emergency Medicine

## 2023-03-24 DIAGNOSIS — K529 Noninfective gastroenteritis and colitis, unspecified: Secondary | ICD-10-CM | POA: Diagnosis not present

## 2023-03-24 DIAGNOSIS — R6889 Other general symptoms and signs: Secondary | ICD-10-CM | POA: Diagnosis not present

## 2023-03-24 HISTORY — DX: Unspecified hearing loss, bilateral: H91.93

## 2023-03-24 LAB — POC COVID19/FLU A&B COMBO
Covid Antigen, POC: NEGATIVE
Influenza A Antigen, POC: NEGATIVE
Influenza B Antigen, POC: NEGATIVE

## 2023-03-24 MED ORDER — ONDANSETRON 4 MG PO TBDP: 4.0000 mg | ORAL_TABLET | Freq: Four times a day (QID) | ORAL | 0 refills | Status: AC | PRN

## 2023-03-24 MED ORDER — CYCLOBENZAPRINE HCL 10 MG PO TABS: 10.0000 mg | ORAL_TABLET | Freq: Two times a day (BID) | ORAL | 0 refills | Status: AC | PRN

## 2023-03-24 NOTE — ED Triage Notes (Signed)
Patient reports that he began having vomiting, headache, body aches since yesterday.  Patient states he took a Zofran ODT. Patient states he took ADvil at 1030 today.
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
## 2023-03-24 NOTE — Discharge Instructions (Addendum)
You can take the muscle relaxer (Flexeril) twice daily. If the medication makes you drowsy, take only at bed time.  The zofran can be used every 6 hours as needed to settle the stomach  Drink lots of fluids! It's okay if you don't have appetite. If you do, try bland foods  I hope you feel better soon!

## 2023-03-27 ENCOUNTER — Encounter (HOSPITAL_COMMUNITY): Payer: Self-pay

## 2023-03-27 ENCOUNTER — Inpatient Hospital Stay (HOSPITAL_COMMUNITY): Payer: 59

## 2023-03-27 ENCOUNTER — Inpatient Hospital Stay (HOSPITAL_COMMUNITY)
Admission: EM | Admit: 2023-03-27 | Discharge: 2023-03-30 | DRG: 390 | Disposition: A | Discharge status: Home / Self Care | Payer: 59 | Attending: Internal Medicine | Admitting: Internal Medicine

## 2023-03-27 ENCOUNTER — Other Ambulatory Visit: Payer: Self-pay

## 2023-03-27 ENCOUNTER — Emergency Department (HOSPITAL_COMMUNITY): Payer: 59

## 2023-03-27 DIAGNOSIS — J449 Chronic obstructive pulmonary disease, unspecified: Secondary | ICD-10-CM | POA: Diagnosis present

## 2023-03-27 DIAGNOSIS — Z841 Family history of disorders of kidney and ureter: Secondary | ICD-10-CM

## 2023-03-27 DIAGNOSIS — Z1152 Encounter for screening for COVID-19: Secondary | ICD-10-CM | POA: Diagnosis not present

## 2023-03-27 DIAGNOSIS — R112 Nausea with vomiting, unspecified: Secondary | ICD-10-CM

## 2023-03-27 DIAGNOSIS — Z8249 Family history of ischemic heart disease and other diseases of the circulatory system: Secondary | ICD-10-CM | POA: Diagnosis not present

## 2023-03-27 DIAGNOSIS — Z87891 Personal history of nicotine dependence: Secondary | ICD-10-CM

## 2023-03-27 DIAGNOSIS — K56609 Unspecified intestinal obstruction, unspecified as to partial versus complete obstruction: Secondary | ICD-10-CM | POA: Diagnosis present

## 2023-03-27 DIAGNOSIS — K219 Gastro-esophageal reflux disease without esophagitis: Secondary | ICD-10-CM | POA: Diagnosis present

## 2023-03-27 DIAGNOSIS — Z820 Family history of epilepsy and other diseases of the nervous system: Secondary | ICD-10-CM | POA: Diagnosis not present

## 2023-03-27 DIAGNOSIS — H9193 Unspecified hearing loss, bilateral: Secondary | ICD-10-CM | POA: Diagnosis present

## 2023-03-27 DIAGNOSIS — K56600 Partial intestinal obstruction, unspecified as to cause: Principal | ICD-10-CM

## 2023-03-27 DIAGNOSIS — H913 Deaf nonspeaking, not elsewhere classified: Secondary | ICD-10-CM | POA: Diagnosis present

## 2023-03-27 DIAGNOSIS — E876 Hypokalemia: Secondary | ICD-10-CM | POA: Diagnosis present

## 2023-03-27 DIAGNOSIS — K5651 Intestinal adhesions [bands], with partial obstruction: Secondary | ICD-10-CM | POA: Diagnosis present

## 2023-03-27 DIAGNOSIS — I119 Hypertensive heart disease without heart failure: Secondary | ICD-10-CM | POA: Diagnosis present

## 2023-03-27 DIAGNOSIS — Z6831 Body mass index (BMI) 31.0-31.9, adult: Secondary | ICD-10-CM

## 2023-03-27 DIAGNOSIS — Z9049 Acquired absence of other specified parts of digestive tract: Secondary | ICD-10-CM

## 2023-03-27 DIAGNOSIS — G4733 Obstructive sleep apnea (adult) (pediatric): Secondary | ICD-10-CM | POA: Diagnosis present

## 2023-03-27 DIAGNOSIS — Z79899 Other long term (current) drug therapy: Secondary | ICD-10-CM | POA: Diagnosis not present

## 2023-03-27 DIAGNOSIS — Z87442 Personal history of urinary calculi: Secondary | ICD-10-CM | POA: Diagnosis not present

## 2023-03-27 DIAGNOSIS — Z833 Family history of diabetes mellitus: Secondary | ICD-10-CM

## 2023-03-27 DIAGNOSIS — E66811 Obesity, class 1: Secondary | ICD-10-CM | POA: Diagnosis present

## 2023-03-27 LAB — URINALYSIS, ROUTINE W REFLEX MICROSCOPIC
Bilirubin Urine: NEGATIVE
Glucose, UA: NEGATIVE mg/dL
Hgb urine dipstick: NEGATIVE
Ketones, ur: NEGATIVE mg/dL
Leukocytes,Ua: NEGATIVE
Nitrite: NEGATIVE
Protein, ur: NEGATIVE mg/dL
pH: 6 (ref 5.0–8.0)

## 2023-03-27 LAB — COMPREHENSIVE METABOLIC PANEL
ALT: 19 U/L (ref 0–44)
AST: 31 U/L (ref 15–41)
Albumin: 4.3 g/dL (ref 3.5–5.0)
Alkaline Phosphatase: 69 U/L (ref 38–126)
Anion gap: 11 (ref 5–15)
BUN: 17 mg/dL (ref 6–20)
CO2: 25 mmol/L (ref 22–32)
Calcium: 9.4 mg/dL (ref 8.9–10.3)
Chloride: 103 mmol/L (ref 98–111)
Creatinine, Ser: 0.73 mg/dL (ref 0.61–1.24)
GFR, Estimated: >60 mL/min (ref >60)
Glucose, Bld: 111 mg/dL — ABNORMAL HIGH (ref 70–99)
Potassium: 3.6 mmol/L (ref 3.5–5.1)
Sodium: 139 mmol/L (ref 135–145)
Total Bilirubin: 1.5 mg/dL — ABNORMAL HIGH (ref 0.0–1.2)
Total Protein: 7.1 g/dL (ref 6.5–8.1)

## 2023-03-27 LAB — CBC
HCT: 47.1 % (ref 39.0–52.0)
Hemoglobin: 16.4 g/dL (ref 13.0–17.0)
MCH: 31.2 pg (ref 26.0–34.0)
MCHC: 34.8 g/dL (ref 30.0–36.0)
MCV: 89.7 fL (ref 80.0–100.0)
Platelets: 177 10*3/uL (ref 150–400)
RBC: 5.25 MIL/uL (ref 4.22–5.81)
RDW: 11.7 % (ref 11.5–15.5)
WBC: 7.6 10*3/uL (ref 4.0–10.5)
nRBC: 0 % (ref 0.0–0.2)

## 2023-03-27 LAB — RESP PANEL BY RT-PCR (RSV, FLU A&B, COVID)  RVPGX2
Influenza A by PCR: NEGATIVE
Influenza B by PCR: NEGATIVE
Resp Syncytial Virus by PCR: NEGATIVE
SARS Coronavirus 2 by RT PCR: NEGATIVE

## 2023-03-27 LAB — LIPASE, BLOOD: Lipase: 30 U/L (ref 11–51)

## 2023-03-27 LAB — HIV ANTIBODY (ROUTINE TESTING W REFLEX): HIV Screen 4th Generation wRfx: NONREACTIVE

## 2023-03-27 MED ORDER — ALUM & MAG HYDROXIDE-SIMETH 200-200-20 MG/5ML PO SUSP: 30.0000 mL | Freq: Four times a day (QID) | ORAL | Status: DC | PRN

## 2023-03-27 MED ORDER — ACETAMINOPHEN 650 MG RE SUPP
650.0000 mg | Freq: Four times a day (QID) | RECTAL | Status: DC | PRN
  Filled 2023-03-27: qty 1

## 2023-03-27 MED ORDER — SALINE SPRAY 0.65 % NA SOLN: 1.0000 | Freq: Four times a day (QID) | NASAL | Status: DC | PRN

## 2023-03-27 MED ORDER — LACTATED RINGERS IV BOLUS: 1000.0000 mL | Freq: Three times a day (TID) | INTRAVENOUS | Status: AC | PRN

## 2023-03-27 MED ORDER — PANTOPRAZOLE SODIUM 40 MG IV SOLR
40.0000 mg | Freq: Every day | INTRAVENOUS | Status: DC
Start: 2023-03-27 — End: 2023-03-30
  Administered 2023-03-27 – 2023-03-29 (×3): 40 mg via INTRAVENOUS
  Filled 2023-03-27 (×3): qty 10

## 2023-03-27 MED ORDER — BISACODYL 10 MG RE SUPP
10.0000 mg | Freq: Every day | RECTAL | Status: DC
  Administered 2023-03-27 – 2023-03-29 (×3): 10 mg via RECTAL
  Filled 2023-03-27 (×3): qty 1

## 2023-03-27 MED ORDER — PHENOL 1.4 % MT LIQD: 2.0000 | OROMUCOSAL | Status: DC | PRN

## 2023-03-27 MED ORDER — BENZOCAINE 20 % MT AERO
INHALATION_SPRAY | Freq: Once | OROMUCOSAL | Status: AC
  Administered 2023-03-27: 1 via OROMUCOSAL

## 2023-03-27 MED ORDER — CYCLOBENZAPRINE HCL 10 MG PO TABS
10.0000 mg | ORAL_TABLET | Freq: Two times a day (BID) | ORAL | Status: DC | PRN
  Filled 2023-03-27: qty 1

## 2023-03-27 MED ORDER — LACTATED RINGERS IV BOLUS
1000.0000 mL | Freq: Once | INTRAVENOUS | Status: AC
  Administered 2023-03-27: 1000 mL via INTRAVENOUS

## 2023-03-27 MED ORDER — LIDOCAINE HCL URETHRAL/MUCOSAL 2 % EX GEL
1.0000 | Freq: Once | CUTANEOUS | Status: AC
  Administered 2023-03-27: 1
  Filled 2023-03-27: qty 11

## 2023-03-27 MED ORDER — OXYMETAZOLINE HCL 0.05 % NA SOLN
1.0000 | Freq: Once | NASAL | Status: AC
  Administered 2023-03-27: 1 via NASAL
  Filled 2023-03-27: qty 30

## 2023-03-27 MED ORDER — TRAZODONE HCL 50 MG PO TABS
25.0000 mg | ORAL_TABLET | Freq: Every evening | ORAL | Status: DC | PRN
Start: 2023-03-27 — End: 2023-03-30

## 2023-03-27 MED ORDER — KCL IN DEXTROSE-NACL 40-5-0.45 MEQ/L-%-% IV SOLN
INTRAVENOUS | Status: AC
  Filled 2023-03-27 (×3): qty 1000

## 2023-03-27 MED ORDER — PROCHLORPERAZINE EDISYLATE 10 MG/2ML IJ SOLN: 5.0000 mg | INTRAMUSCULAR | Status: DC | PRN

## 2023-03-27 MED ORDER — SIMETHICONE 40 MG/0.6ML PO SUSP: 80.0000 mg | Freq: Four times a day (QID) | ORAL | Status: DC | PRN

## 2023-03-27 MED ORDER — HYDROMORPHONE HCL 1 MG/ML IJ SOLN
0.5000 mg | INTRAMUSCULAR | Status: DC | PRN
  Administered 2023-03-27 – 2023-03-29 (×4): 1 mg via INTRAVENOUS
  Filled 2023-03-27 (×5): qty 1

## 2023-03-27 MED ORDER — IOHEXOL 300 MG/ML  SOLN
100.0000 mL | Freq: Once | INTRAMUSCULAR | Status: AC | PRN
  Administered 2023-03-27: 100 mL via INTRAVENOUS

## 2023-03-27 MED ORDER — SODIUM CHLORIDE (PF) 0.9 % IJ SOLN
INTRAMUSCULAR | Status: AC
  Filled 2023-03-27: qty 50

## 2023-03-27 MED ORDER — NAPHAZOLINE-GLYCERIN 0.012-0.25 % OP SOLN
1.0000 [drp] | Freq: Four times a day (QID) | OPHTHALMIC | Status: DC | PRN
  Administered 2023-03-29: 2 [drp] via OPHTHALMIC
  Filled 2023-03-27: qty 15

## 2023-03-27 MED ORDER — ALBUTEROL SULFATE (2.5 MG/3ML) 0.083% IN NEBU: 2.5000 mg | INHALATION_SOLUTION | RESPIRATORY_TRACT | Status: DC | PRN

## 2023-03-27 MED ORDER — PANTOPRAZOLE SODIUM 40 MG IV SOLR
40.0000 mg | Freq: Once | INTRAVENOUS | Status: AC
  Administered 2023-03-27: 40 mg via INTRAVENOUS
  Filled 2023-03-27: qty 10

## 2023-03-27 MED ORDER — METHOCARBAMOL 1000 MG/10ML IJ SOLN: 1000.0000 mg | Freq: Four times a day (QID) | INTRAMUSCULAR | Status: DC | PRN

## 2023-03-27 MED ORDER — ONDANSETRON 4 MG PO TBDP
4.0000 mg | ORAL_TABLET | Freq: Once | ORAL | Status: AC
  Administered 2023-03-27: 4 mg via ORAL
  Filled 2023-03-27: qty 1

## 2023-03-27 MED ORDER — ONDANSETRON HCL 4 MG/2ML IJ SOLN: 4.0000 mg | Freq: Four times a day (QID) | INTRAMUSCULAR | Status: DC | PRN

## 2023-03-27 MED ORDER — MENTHOL 3 MG MT LOZG: 1.0000 | LOZENGE | OROMUCOSAL | Status: DC | PRN

## 2023-03-27 MED ORDER — DIATRIZOATE MEGLUMINE & SODIUM 66-10 % PO SOLN
90.0000 mL | Freq: Once | ORAL | Status: AC
  Administered 2023-03-27: 90 mL via NASOGASTRIC
  Filled 2023-03-27: qty 90

## 2023-03-27 MED ORDER — ENOXAPARIN SODIUM 40 MG/0.4ML IJ SOSY
40.0000 mg | PREFILLED_SYRINGE | INTRAMUSCULAR | Status: DC
  Administered 2023-03-27 – 2023-03-29 (×3): 40 mg via SUBCUTANEOUS
  Filled 2023-03-27 (×3): qty 0.4

## 2023-03-27 MED ORDER — MAGIC MOUTHWASH: 15.0000 mL | Freq: Four times a day (QID) | ORAL | Status: DC | PRN

## 2023-03-27 NOTE — Plan of Care (Signed)

## 2023-03-27 NOTE — ED Provider Triage Note (Signed)
Emergency Medicine Provider Triage Evaluation Note  Greg Velez , a 48 y.o. male  was evaluated in triage.  Pt complains of 5 days of upper abdominal pain epigastric region.  Patient's had on and off subjective fevers and states he was diagnosed with the flu at urgent care.  Patient is unable to keep the Zofran down has not had any food or drink for the past few days.  Patient states that he is having bilious emesis but does not have his gallbladder as this was removed in 2005.  Patient denies chest pain shortness of breath.  ASL interpreter [phone]  Review of Systems  Positive:  Negative:   Physical Exam  BP (!) 140/92 (BP Location: Left Arm)   Pulse 82   Temp 98.8 F (37.1 C) (Oral)   Resp 18   Ht 6\' 2"  (1.88 m)   Wt 112.5 kg   SpO2 97%   BMI 31.84 kg/m  Gen:   Awake, no distress   Resp:  Normal effort  MSK:   Moves extremities without difficulty  Other:  Epigastric tenderness without peritoneal signs  Medical Decision Making  Medically screening exam initiated at 8:12 AM.  Appropriate orders placed.  Greg Velez was informed that the remainder of the evaluation will be completed by another provider, this initial triage assessment does not replace that evaluation, and the importance of remaining in the ED until their evaluation is complete.  Labs ordered along with medications, do suspect patient may have some component of gastritis however given the history of bilious emesis we will get imaging, patient stable at this time.   Greg Velez, [place] 03/27/23 [phone]

## 2023-03-27 NOTE — ED Notes (Addendum)
Attempted tube, partial success, 300cc brown liquid return, curled in throat, gagging ensued, subsequently removed, agreeable to 2nd attempt, smaller tube, will try 30fr. D/w pt using [employer] video ASL interpreter. Family at [facility].

## 2023-03-27 NOTE — Plan of Care (Signed)
   Problem: Education: Goal: Knowledge of General Education information will improve Description Including pain rating scale, medication(s)/side effects and non-pharmacologic comfort measures Outcome: Progressing

## 2023-03-27 NOTE — Plan of Care (Signed)
  Problem: Education: Goal: Knowledge of General Education information will improve Description: Including pain rating scale, medication(s)/side effects and non-pharmacologic comfort measures Outcome: Progressing   Problem: Health Behavior/Discharge Planning: Goal: Ability to manage health-related needs will improve Outcome: Progressing   Problem: Clinical Measurements: Goal: Ability to maintain clinical measurements within normal limits will improve Outcome: Progressing Goal: Will remain free from infection Outcome: Progressing Goal: Diagnostic test results will improve Outcome: Progressing Goal: Respiratory complications will improve Outcome: Progressing Goal: Cardiovascular complication will be avoided Outcome: Progressing   Problem: Clinical Measurements: Goal: Ability to maintain clinical measurements within normal limits will improve Outcome: Progressing Goal: Will remain free from infection Outcome: Progressing Goal: Diagnostic test results will improve Outcome: Progressing Goal: Respiratory complications will improve Outcome: Progressing Goal: Cardiovascular complication will be avoided Outcome: Progressing   Problem: Education: Goal: Knowledge of General Education information will improve Description: Including pain rating scale, medication(s)/side effects and non-pharmacologic comfort measures Outcome: Progressing   Problem: Activity: Goal: Risk for activity intolerance will decrease Outcome: Progressing

## 2023-03-27 NOTE — ED Notes (Signed)
 Xray at [facility]

## 2023-03-27 NOTE — ED Notes (Signed)
WALLY is in the room for sign language

## 2023-03-27 NOTE — ED Provider Notes (Signed)
[facility] AT [facility] Provider Note   CSN: [id number] Arrival date & time: 03/27/23  4098     History  Chief Complaint  Patient presents with   Emesis   History obtained through use of ASL interpreter  Greg Velez is a 48 y.o. male with hypertension, COPD, deaf, presents with concern for about one week of nausea and yellow emesis.  Also reports some generalized abdominal pain that began this morning.  He reports he is having some diarrhea illness which began, but last bowel movement was 2 days ago.  No longer passing flatus.  States he was seen at urgent care couple days ago and sent home with Zofran for possible flu, states this has not helped.   Emesis      Home Medications Prior to Admission medications   Medication Sig Start Date End Date Taking? Authorizing Provider  amLODipine (NORVASC) 5 MG tablet Take 1 tablet (5 mg total) by mouth daily. Patient taking differently: Take 5 mg by mouth daily. 07/04/20   Meriam Sprague, MD  atorvastatin (LIPITOR) 10 MG tablet Take 1 tablet (10 mg total) by mouth daily. Patient taking differently: Take 10 mg by mouth at bedtime. 07/04/20   Meriam Sprague, MD  carvedilol (COREG) 6.25 MG tablet Take 1 tablet (6.25 mg total) by mouth 2 (two) times daily. Patient taking differently: Take 6.25 mg by mouth 2 (two) times daily. 07/04/20   Meriam Sprague, MD  cyclobenzaprine (FLEXERIL) 10 MG tablet Take 1 tablet (10 mg total) by mouth 2 (two) times daily as needed for muscle spasms. 03/24/23   Rising, Lurena Joiner, PA-C  ibuprofen (ADVIL) 600 MG tablet Take 600 mg by mouth every 6 (six) hours as needed. 12/01/22   [provider]  losartan (COZAAR) 100 MG tablet Take 1 tablet (100 mg total) by mouth daily. Patient taking differently: Take 1 tablet by mouth daily. 07/04/20   Meriam Sprague, MD  naproxen (NAPROSYN) 500 MG tablet Take 1 tablet (500 mg total) by mouth 2 (two) times daily. 10/19/22    Domenick Gong, MD  omeprazole (PRILOSEC) 40 MG capsule Take 1 capsule (40 mg total) by mouth daily. Patient taking differently: Take 40 mg by mouth daily. 05/09/20   Meriam Sprague, MD  ondansetron (ZOFRAN-ODT) 4 MG disintegrating tablet Take 1 tablet (4 mg total) by mouth every 6 (six) hours as needed for nausea or vomiting. 03/24/23   Rising, Lurena Joiner, PA-C  Vitamin D, Ergocalciferol, (DRISDOL) 1.25 MG (50000 UNIT) CAPS capsule Take 50,000 Units by mouth once a week.    [provider]      Allergies    Patient has no known allergies.    Review of Systems   Review of Systems  Gastrointestinal:  Positive for vomiting.    Physical Exam Updated Vital Signs BP (!) 144/95   Pulse 66   Temp 98.4 F (36.9 C) (Oral)   Resp 16   Ht 6\' 2"  (1.88 m)   Wt 112.5 kg   SpO2 99%   BMI 31.84 kg/m  Physical Exam Vitals and nursing note reviewed.  Constitutional:      General: He is not in acute distress.    Appearance: He is well-developed.     Comments: Well appearing, no active emesis while in the room  Talking via ASL interpreter  HENT:     Head: Normocephalic and atraumatic.  Eyes:     Conjunctiva/sclera: Conjunctivae normal.  Cardiovascular:  Rate and Rhythm: Normal rate and regular rhythm.     Heart sounds: No murmur heard. Pulmonary:     Effort: Pulmonary effort is normal. No respiratory distress.     Breath sounds: Normal breath sounds.  Abdominal:     Palpations: Abdomen is soft.     Tenderness: There is abdominal tenderness.     Comments: Mild epigastric tenderness palpation  Musculoskeletal:        General: No swelling.     Cervical back: Neck supple.  Skin:    General: Skin is warm and dry.     Capillary Refill: Capillary refill takes less than 2 seconds.  Neurological:     Mental Status: He is alert.  Psychiatric:        Mood and Affect: Mood normal.     ED Results / Procedures / Treatments   Labs (all labs ordered are listed, but only  abnormal results are displayed) Labs Reviewed  COMPREHENSIVE METABOLIC PANEL - Abnormal; Notable for the following components:      Result Value   Glucose, Bld 111 (*)    Total Bilirubin 1.5 (*)    All other components within normal limits  RESP PANEL BY RT-PCR (RSV, FLU A&B, COVID)  RVPGX2  LIPASE, BLOOD  CBC  URINALYSIS, ROUTINE W REFLEX MICROSCOPIC    EKG None  Radiology CT ABDOMEN PELVIS W CONTRAST Result Date: 03/27/2023 CLINICAL DATA:  Epigastric pain, vomiting EXAM: CT ABDOMEN AND PELVIS WITH CONTRAST TECHNIQUE: Multidetector CT imaging of the abdomen and pelvis was performed using the standard protocol following bolus administration of intravenous contrast. RADIATION DOSE REDUCTION: This exam was performed according to the departmental dose-optimization program which includes automated exposure control, adjustment of the mA and/or kV according to patient size and/or use of iterative reconstruction technique. CONTRAST:  OMNIPAQUE IOHEXOL 300 MG/ML  SOLN COMPARISON:  12/22/2022 FINDINGS: Lower chest: No acute abnormality. Hepatobiliary: No focal liver abnormality is seen. Status post cholecystectomy. No biliary dilatation. Pancreas: Unremarkable. No pancreatic ductal dilatation or surrounding inflammatory changes. Spleen: Normal in size without focal abnormality. Adrenals/Urinary Tract: Unremarkable adrenal glands. Kidneys enhance symmetrically. There are a few tiny punctate nonobstructing stones within each kidney. No hydronephrosis. No ureteral calculi. Urinary bladder is decompressed. Stomach/Bowel: Stomach is moderately distended but appears otherwise unremarkable. Multiple dilated loops of small bowel measuring up to 4.8 cm in diameter. Multiple air-fluid levels. There is some fecalization of small bowel content. Gradual tapering to decompressed bowel in the mid right abdomen (series 2, images 35-38). No abrupt transition point. There is some liquid stool within the colon. Normal  appendix in the right lower quadrant (series 2, image 68). Vascular/Lymphatic: No significant vascular findings are present. No enlarged abdominal or pelvic lymph nodes. Reproductive: Prostate is unremarkable. Other: Small volume free fluid within the pelvis. No pneumoperitoneum. No abdominal wall hernia. Musculoskeletal: No acute or significant osseous findings. IMPRESSION: 1. Multiple dilated loops of small bowel with gradual tapering to decompressed bowel in the mid right abdomen. No abrupt transition point. Findings are favored to represent a partial small bowel obstruction. 2. Small volume free fluid within the pelvis, likely reactive. 3. Bilateral nonobstructing nephrolithiasis. Electronically Signed   By: Duanne Guess D.O.   On: 03/27/2023 10:27    Procedures Procedures    Medications Ordered in ED Medications  diatrizoate meglumine-sodium (GASTROGRAFIN) 66-10 % solution 90 mL (has no administration in time range)  lactated ringers bolus 1,000 mL (has no administration in time range)  lactated ringers bolus 1,000  mL (has no administration in time range)  dextrose 5 % and 0.45 % NaCl with KCl 40 mEq/L infusion (has no administration in time range)  HYDROmorphone (DILAUDID) injection 0.5-2 mg (has no administration in time range)  methocarbamol (ROBAXIN) injection 1,000 mg (has no administration in time range)  acetaminophen (TYLENOL) suppository 650 mg (has no administration in time range)  ondansetron (ZOFRAN) injection 4 mg (has no administration in time range)  prochlorperazine (COMPAZINE) injection 5-10 mg (has no administration in time range)  phenol (CHLORASEPTIC) mouth spray 2 spray (has no administration in time range)  menthol-cetylpyridinium (CEPACOL) lozenge 3 mg (has no administration in time range)  magic mouthwash (has no administration in time range)  alum & mag hydroxide-simeth (MAALOX/MYLANTA) 200-200-20 MG/5ML suspension 30 mL (has no administration in time range)   simethicone (MYLICON) 40 MG/0.6ML suspension 80 mg (has no administration in time range)  bisacodyl (DULCOLAX) suppository 10 mg (has no administration in time range)  naphazoline-glycerin (CLEAR EYES REDNESS) ophth solution 1-2 drop (has no administration in time range)  sodium chloride (OCEAN) 0.65 % nasal spray 1-2 spray (has no administration in time range)  oxymetazoline (AFRIN) 0.05 % nasal spray 1 spray (has no administration in time range)  lidocaine (XYLOCAINE) 2 % jelly 1 Application (has no administration in time range)  ondansetron (ZOFRAN-ODT) disintegrating tablet 4 mg (4 mg Oral Given 03/27/23 0804)  pantoprazole (PROTONIX) injection 40 mg (40 mg Intravenous Given 03/27/23 0827)  iohexol (OMNIPAQUE) 300 MG/ML solution 100 mL (100 mLs Intravenous Contrast Given 03/27/23 1610)    ED Course/ Medical Decision Making/ A&P                                 Medical Decision Making Amount and/or Complexity of Data Reviewed Labs: ordered. Radiology: ordered.  Risk OTC drugs. Prescription drug management. Decision regarding hospitalization.     Differential diagnosis includes but is not limited to Cholelithiasis, cholangitis, choledocholithiasis, peptic ulcer, gastritis, gastroenteritis, appendicitis, IBS, IBD, DKA, nephrolithiasis, UTI, pyelonephritis, pancreatitis, diverticulitis, mesenteric ischemia, abdominal aortic aneurysm, small bowel obstruction, volvulus, testicular torsion in males, ovarian torsion and pregnancy related concerns in females of childbearing age    ED Course:  Patient overall well-appearing, no active emesis in the room.  Vital signs stable aside from a slightly elevated blood pressure of 144/95.  Abdomen slightly tender to palpation in the epigastric region.    I Ordered, and personally interpreted labs and imaging.  The pertinent results include:  CT abdomen pelvis showed partial small bowel obstruction.  No leukocytosis, no LFT abnormalities or elevated  creatinine, lipase within normal limits, no concern for any other acute abdominal pathology.  Respiratory panel negative.  I consulted with general surgery Dr. Michaell Cowing who is recommending the partial small bowel obstruction protocol with NG tube placement and will plan to see patient in consult.  I ordered the small bowel obstruction protocol. Also consulted hospitalist Dr. Kirby Crigler who will admit patient for further management.    Impression: Partial small bowel obstruction  Disposition:  Admission with  hospitalist Dr. Kirby Crigler for further management  Imaging Studies ordered: I ordered imaging studies including CT abdomen pelvis I independently visualized the imaging with scope of interpretation limited to determining acute life threatening conditions related to emergency care. Imaging showed partial small bowel obstruction I agree with the radiologist interpretation   Consultations Obtained: I requested consultation with the general surgery Dr. Michaell Cowing,  and discussed lab and  imaging findings as well as pertinent plan - they recommend: Starting small bowel obstruction protocol with NG tube, they will plan to see patient in consult  I requested consultation with the hospitalist Dr. Kirby Crigler,  and discussed lab and imaging findings as well as pertinent plan - they recommend: admission   External records from outside source obtained and reviewed including urgent care note from 03/24/2023 where his flu and COVID were negative, and he was prescribed Zofran.              Final Clinical Impression(s) / ED Diagnoses Final diagnoses:  Partial small bowel obstruction (HCC)  Nausea and vomiting, unspecified vomiting type  Small bowel obstruction [facility])    Rx / DC Orders ED Discharge Orders     None         Arabella Merles, PA-C 03/27/23 1145    Linwood Dibbles, MD 03/28/23 [phone]

## 2023-03-27 NOTE — ED Notes (Signed)
Tolerating NGT well. Alert, NAD, calm, interactive. No coughing or gagging. VSS. Gastric return brownish/ yellow. Pending radiology read. SPO2  95%.
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
## 2023-03-27 NOTE — ED Triage Notes (Signed)
Patient was seen 4 days ago at urgent care for nausea, vomiting, headache, and body aches. Dx with flu. Sent home with Zofran and muscle relaxer, states it has not helped. Now has abdominal pain.

## 2023-03-28 ENCOUNTER — Inpatient Hospital Stay (HOSPITAL_COMMUNITY): Payer: 59

## 2023-03-28 DIAGNOSIS — K56609 Unspecified intestinal obstruction, unspecified as to partial versus complete obstruction: Secondary | ICD-10-CM | POA: Diagnosis not present

## 2023-03-28 LAB — BASIC METABOLIC PANEL
Anion gap: 9 (ref 5–15)
BUN: 18 mg/dL (ref 6–20)
CO2: 24 mmol/L (ref 22–32)
Calcium: 8.5 mg/dL — ABNORMAL LOW (ref 8.9–10.3)
Chloride: 107 mmol/L (ref 98–111)
Creatinine, Ser: 0.82 mg/dL (ref 0.61–1.24)
GFR, Estimated: >60 mL/min (ref >60)
Glucose, Bld: 110 mg/dL — ABNORMAL HIGH (ref 70–99)
Potassium: 3.3 mmol/L — ABNORMAL LOW (ref 3.5–5.1)
Sodium: 140 mmol/L (ref 135–145)

## 2023-03-28 LAB — CBC
HCT: 41.8 % (ref 39.0–52.0)
Hemoglobin: 14.1 g/dL (ref 13.0–17.0)
MCH: 30.7 pg (ref 26.0–34.0)
MCHC: 33.7 g/dL (ref 30.0–36.0)
MCV: 90.9 fL (ref 80.0–100.0)
Platelets: 154 10*3/uL (ref 150–400)
RBC: 4.6 MIL/uL (ref 4.22–5.81)
RDW: 11.8 % (ref 11.5–15.5)
WBC: 6.7 10*3/uL (ref 4.0–10.5)
nRBC: 0 % (ref 0.0–0.2)

## 2023-03-28 LAB — MAGNESIUM: Magnesium: 1.7 mg/dL (ref 1.7–2.4)

## 2023-03-28 MED ORDER — POTASSIUM CHLORIDE 10 MEQ/100ML IV SOLN
10.0000 meq | INTRAVENOUS | Status: AC
Start: 2023-03-28 — End: 2023-03-28
  Administered 2023-03-28 (×3): 10 meq via INTRAVENOUS
  Filled 2023-03-28: qty 100

## 2023-03-28 MED ORDER — MAGNESIUM OXIDE -MG SUPPLEMENT 400 (240 MG) MG PO TABS: 400.0000 mg | ORAL_TABLET | Freq: Two times a day (BID) | ORAL | Status: DC

## 2023-03-28 MED ORDER — MAGNESIUM SULFATE 2 GM/50ML IV SOLN
2.0000 g | Freq: Once | INTRAVENOUS | Status: AC
  Administered 2023-03-28: 2 g via INTRAVENOUS
  Filled 2023-03-28: qty 50

## 2023-03-28 MED ORDER — POTASSIUM CHLORIDE 20 MEQ PO PACK
40.0000 meq | PACK | Freq: Once | ORAL | Status: AC
  Administered 2023-03-28: 40 meq via ORAL
  Filled 2023-03-28: qty 2

## 2023-03-28 MED ORDER — HYDRALAZINE HCL 20 MG/ML IJ SOLN: 5.0000 mg | Freq: Four times a day (QID) | INTRAMUSCULAR | Status: DC | PRN

## 2023-03-28 NOTE — Progress Notes (Signed)
Progress Note     Subjective: Overall feeling better with less bloating and abdominal pain - one episode of mild lower abdominal cramping that was less severe than initial pain. Having loose bowel movements. No nausea  Wife at bedside Objective: Vital signs in last 24 hours: Temp:  [97.5 F (36.4 C)-99 F (37.2 C)] 98.9 F (37.2 C) (02/03 0533) Pulse Rate:  [59-85] 60 (02/03 0533) Resp:  [16-17] 16 (02/03 0533) BP: (114-149)/(73-102) 114/86 (02/03 0533) SpO2:  [93 %-99 %] 97 % (02/03 0533) Last BM Date : 03/27/23  Intake/Output from previous day: 02/02 0701 - 02/03 0700 In: 1363.5 [P.O.:60; I.V.:808.8; IV Piggyback:494.7] Out: 650 [Emesis/NG output:650] Intake/Output this shift: No intake/output data recorded.  PE: General: pleasant, WD, male who is laying in bed in NAD Lungs: Respiratory effort nonlabored Abd: soft, ND, mild TTP in epigastrium and suprapubic region. NGT with thin yellow output MSK: all 4 extremities are symmetrical with no cyanosis, clubbing, or edema. Skin: warm and dry  Psych: A&Ox3 with an appropriate affect.    Lab Results:  Recent Labs    03/27/23 0823 03/28/23 0444  WBC 7.6 6.7  HGB 16.4 14.1  HCT 47.1 41.8  PLT 177 154   BMET Recent Labs    03/27/23 0823 03/28/23 0444  NA 139 140  K 3.6 3.3*  CL 103 107  CO2 25 24  GLUCOSE 111* 110*  BUN 17 18  CREATININE 0.73 0.82  CALCIUM 9.4 8.5*   PT/INR No results for input(s): "LABPROT", "INR" in the last 72 hours. CMP     Component Value Date/Time   NA 140 03/28/2023 0444   NA 144 09/02/2022 1234   K 3.3 (L) 03/28/2023 0444   CL 107 03/28/2023 0444   CO2 24 03/28/2023 0444   GLUCOSE 110 (H) 03/28/2023 0444   BUN 18 03/28/2023 0444   BUN 13 09/02/2022 1234   CREATININE 0.82 03/28/2023 0444   CALCIUM 8.5 (L) 03/28/2023 0444   PROT 7.1 03/27/2023 0823   ALBUMIN 4.3 03/27/2023 0823   AST 31 03/27/2023 0823   ALT 19 03/27/2023 0823   ALKPHOS 69 03/27/2023 0823   BILITOT 1.5  (H) 03/27/2023 0823   GFRNONAA >60 03/28/2023 0444   GFRAA 104 04/04/2020 1135   Lipase     Component Value Date/Time   LIPASE 30 03/27/2023 0823       Studies/Results: DG Abd Portable 1V-Small Bowel Obstruction Protocol-initial, 8 hr delay Result Date: 03/27/2023 CLINICAL DATA:  Small-bowel obstruction 8 hour delay EXAM: PORTABLE ABDOMEN - 1 VIEW COMPARISON:  CT 03/27/2023 FINDINGS: Multiple loops of dilated small bowel, some containing contrast others containing air, this measures up to 4.8 cm. Contrast in the bladder. Contrast in nondistended small bowel in the right lower quadrant. Possible dilute contrast within the left colon. No rectal contrast. IMPRESSION: Contrast present within dilated and nondilated small bowel. Possible dilute contrast present within the left colon Electronically Signed   By: Jasmine Pang M.D.   On: 03/27/2023 20:47   DG Abd Portable 1V-Small Bowel Protocol-Position Verification Result Date: 03/27/2023 CLINICAL DATA:  [id number] Encounter for imaging study to confirm nasogastric (NG) tube placement [id number] EXAM: PORTABLE ABDOMEN - 1 VIEW COMPARISON:  Same day CT FINDINGS: Limited radiograph of the lower chest and upper abdomen was obtained for the purposes of enteric tube localization. Enteric tube is seen coursing below the diaphragm with distal tip and side port coiled within the gastric fundus. IMPRESSION: Enteric tube coiled within the gastric fundus.  Electronically Signed   By: Duanne Guess D.O.   On: 03/27/2023 12:42   CT ABDOMEN PELVIS W CONTRAST Result Date: 03/27/2023 CLINICAL DATA:  Epigastric pain, vomiting EXAM: CT ABDOMEN AND PELVIS WITH CONTRAST TECHNIQUE: Multidetector CT imaging of the abdomen and pelvis was performed using the standard protocol following bolus administration of intravenous contrast. RADIATION DOSE REDUCTION: This exam was performed according to the departmental dose-optimization program which includes automated exposure control,  adjustment of the mA and/or kV according to patient size and/or use of iterative reconstruction technique. CONTRAST:  OMNIPAQUE IOHEXOL 300 MG/ML  SOLN COMPARISON:  12/22/2022 FINDINGS: Lower chest: No acute abnormality. Hepatobiliary: No focal liver abnormality is seen. Status post cholecystectomy. No biliary dilatation. Pancreas: Unremarkable. No pancreatic ductal dilatation or surrounding inflammatory changes. Spleen: Normal in size without focal abnormality. Adrenals/Urinary Tract: Unremarkable adrenal glands. Kidneys enhance symmetrically. There are a few tiny punctate nonobstructing stones within each kidney. No hydronephrosis. No ureteral calculi. Urinary bladder is decompressed. Stomach/Bowel: Stomach is moderately distended but appears otherwise unremarkable. Multiple dilated loops of small bowel measuring up to 4.8 cm in diameter. Multiple air-fluid levels. There is some fecalization of small bowel content. Gradual tapering to decompressed bowel in the mid right abdomen (series 2, images 35-38). No abrupt transition point. There is some liquid stool within the colon. Normal appendix in the right lower quadrant (series 2, image 68). Vascular/Lymphatic: No significant vascular findings are present. No enlarged abdominal or pelvic lymph nodes. Reproductive: Prostate is unremarkable. Other: Small volume free fluid within the pelvis. No pneumoperitoneum. No abdominal wall hernia. Musculoskeletal: No acute or significant osseous findings. IMPRESSION: 1. Multiple dilated loops of small bowel with gradual tapering to decompressed bowel in the mid right abdomen. No abrupt transition point. Findings are favored to represent a partial small bowel obstruction. 2. Small volume free fluid within the pelvis, likely reactive. 3. Bilateral nonobstructing nephrolithiasis. Electronically Signed   By: Duanne Guess D.O.   On: 03/27/2023 10:27    Anti-infectives: Anti-infectives (From admission, onward)    None         Assessment/Plan  Small bowel obstruction most likely due to adhesions with some fecalization raising concern of chronic presentation   - 8 hour film with contrast in colon - repeat xray this am with read pending but contrast throughout colon - NGT output still on high side at 650 ml/24h but having bowel function and symptoms improved - clamp NGT and sips of clears from floor. Back to suction for n/v/distension - encouraged mobilization - keep K.4 and Mag > 2 for bowel function - IV repletion for hypokalemia ordered - mobilize for bowel function - no current indication for surgery. Continue to monitor  FEN: NGT clamped. IVF per primary  ID: none VTE: lovenox  An interpreter was present via video call during the history-taking and subsequent discussion (and for part of the physical exam) with this patient. ID: [id number]   I reviewed hospitalist notes, last 24 h vitals and pain scores, last 48 h intake and output, last 24 h labs and trends, and last 24 h imaging results.    LOS: 1 day   Eric Form, [facility] 03/28/2023, 9:56 AM Please see Amion for pager number during day hours 7:00am-4:30pm

## 2023-03-28 NOTE — Progress Notes (Signed)
   03/28/23 1033  TOC Brief Assessment  Insurance and Status Reviewed  Patient has primary care physician Yes  Home environment has been reviewed home with family  Prior level of function: independent  Prior/Current Home Services No current home services  Social Drivers of Health Review SDOH reviewed no interventions necessary  Readmission risk has been reviewed Yes  Transition of care needs no transition of care needs at this time

## 2023-03-28 NOTE — Progress Notes (Signed)
[facility] PROGRESS NOTE    Progress Note  Greg Velez  ZOX:[id number] DOB: 1975/08/29 DOA: 03/27/2023 PCP: Everardo All, NP     Brief Narrative:   Greg Velez is an 48 y.o. male past medical history of obesity, with a history of cholecystectomy essential hypertension COPD on room air sleep apnea admitted for small bowel obstruction.  CT scan of the abdomen pelvis shows small bowel obstruction, placed n.p.o. NG tube was placed General Surgery was consulted.  Assessment/Plan:   SBO (small bowel obstruction) [facility]): General surgery was consulted, recommended NG tube to intermittent suction. NPO. Started on IV fluids try to potassium greater than 4 magnesium greater than 2. Started on a small bowel protocol contrast within the dilated and nondilated small bowel and colon. Continue strict I's and O's and daily weights. Ambulate patient. Further management per general surgery.  Hypertensive heart disease Hold oral meds. Bp is is controlled. Hydralazine IV prn.  Chronic obstructive pulmonary disease (HCC) Stable,  sating > 97%.  Obesity, Class I, BMI 30-34.9: No.     DVT prophylaxis: lovenox Family Communication:none Status is: Inpatient Remains inpatient appropriate because: SBO    Code Status:     Code Status Orders  (From admission, onward)           Start     Ordered   03/27/23 1315  Full code  Continuous       Question:  By:  Answer:  Consent: discussion documented in EHR   03/27/23 1315           Code Status History     This patient has a current code status but no historical code status.         IV Access:   Peripheral IV   Procedures and diagnostic studies:   DG Abd Portable 1V-Small Bowel Obstruction Protocol-initial, 8 hr delay Result Date: 03/27/2023 CLINICAL DATA:  Small-bowel obstruction 8 hour delay EXAM: PORTABLE ABDOMEN - 1 VIEW COMPARISON:  CT 03/27/2023 FINDINGS: Multiple loops of dilated small bowel, some  containing contrast others containing air, this measures up to 4.8 cm. Contrast in the bladder. Contrast in nondistended small bowel in the right lower quadrant. Possible dilute contrast within the left colon. No rectal contrast. IMPRESSION: Contrast present within dilated and nondilated small bowel. Possible dilute contrast present within the left colon Electronically Signed   By: Jasmine Pang M.D.   On: 03/27/2023 20:47   DG Abd Portable 1V-Small Bowel Protocol-Position Verification Result Date: 03/27/2023 CLINICAL DATA:  [id number] Encounter for imaging study to confirm nasogastric (NG) tube placement [id number] EXAM: PORTABLE ABDOMEN - 1 VIEW COMPARISON:  Same day CT FINDINGS: Limited radiograph of the lower chest and upper abdomen was obtained for the purposes of enteric tube localization. Enteric tube is seen coursing below the diaphragm with distal tip and side port coiled within the gastric fundus. IMPRESSION: Enteric tube coiled within the gastric fundus. Electronically Signed   By: Duanne Guess D.O.   On: 03/27/2023 12:42   CT ABDOMEN PELVIS W CONTRAST Result Date: 03/27/2023 CLINICAL DATA:  Epigastric pain, vomiting EXAM: CT ABDOMEN AND PELVIS WITH CONTRAST TECHNIQUE: Multidetector CT imaging of the abdomen and pelvis was performed using the standard protocol following bolus administration of intravenous contrast. RADIATION DOSE REDUCTION: This exam was performed according to the departmental dose-optimization program which includes automated exposure control, adjustment of the mA and/or kV according to patient size and/or use of iterative reconstruction technique. CONTRAST:  OMNIPAQUE IOHEXOL 300 MG/ML  SOLN  COMPARISON:  12/22/2022 FINDINGS: Lower chest: No acute abnormality. Hepatobiliary: No focal liver abnormality is seen. Status post cholecystectomy. No biliary dilatation. Pancreas: Unremarkable. No pancreatic ductal dilatation or surrounding inflammatory changes. Spleen: Normal in size  without focal abnormality. Adrenals/Urinary Tract: Unremarkable adrenal glands. Kidneys enhance symmetrically. There are a few tiny punctate nonobstructing stones within each kidney. No hydronephrosis. No ureteral calculi. Urinary bladder is decompressed. Stomach/Bowel: Stomach is moderately distended but appears otherwise unremarkable. Multiple dilated loops of small bowel measuring up to 4.8 cm in diameter. Multiple air-fluid levels. There is some fecalization of small bowel content. Gradual tapering to decompressed bowel in the mid right abdomen (series 2, images 35-38). No abrupt transition point. There is some liquid stool within the colon. Normal appendix in the right lower quadrant (series 2, image 68). Vascular/Lymphatic: No significant vascular findings are present. No enlarged abdominal or pelvic lymph nodes. Reproductive: Prostate is unremarkable. Other: Small volume free fluid within the pelvis. No pneumoperitoneum. No abdominal wall hernia. Musculoskeletal: No acute or significant osseous findings. IMPRESSION: 1. Multiple dilated loops of small bowel with gradual tapering to decompressed bowel in the mid right abdomen. No abrupt transition point. Findings are favored to represent a partial small bowel obstruction. 2. Small volume free fluid within the pelvis, likely reactive. 3. Bilateral nonobstructing nephrolithiasis. Electronically Signed   By: Duanne Guess D.O.   On: 03/27/2023 10:27     Medical Consultants:   None.   Subjective:    Greg Velez relates he is passing gas having watery bowel movements.  Objective:    Vitals:   03/27/23 1742 03/27/23 2136 03/28/23 0152 03/28/23 0533  BP: 120/73 (!) 125/93 122/88 114/86  Pulse: (!) 59 63 61 60  Resp: 17 16 16 16   Temp: (!) 97.5 F (36.4 C) 98.9 F (37.2 C) 98.9 F (37.2 C) 98.9 F (37.2 C)  TempSrc: Oral Oral Oral Oral  SpO2: 94% 95% 96% 97%  Weight:      Height:       SpO2: 97 %   Intake/Output Summary (Last 24  hours) at 03/28/2023 0828 Last data filed at 03/28/2023 0024 Gross per 24 hour  Intake 1363.49 ml  Output 650 ml  Net 713.49 ml   Filed Weights   03/27/23 0754  Weight: 112.5 kg    Exam: General exam: In no acute distress. Respiratory system: Good air movement and clear to auscultation. Cardiovascular system: S1 & S2 heard, RRR. No JVD. Gastrointestinal system: Abdomen is nondistended, soft and nontender.  Extremities: No pedal edema. Skin: No rashes, lesions or ulcers Psychiatry: Judgement and insight appear normal. Mood & affect appropriate.    Data Reviewed:    Labs: Basic Metabolic Panel: Recent Labs  Lab 03/27/23 0823 03/28/23 0444  NA 139 140  K 3.6 3.3*  CL 103 107  CO2 25 24  GLUCOSE 111* 110*  BUN 17 18  CREATININE 0.73 0.82  CALCIUM 9.4 8.5*   GFR Estimated Creatinine Clearance: 143.8 mL/min (by C-G formula based on SCr of 0.82 mg/dL). Liver Function Tests: Recent Labs  Lab 03/27/23 0823  AST 31  ALT 19  ALKPHOS 69  BILITOT 1.5*  PROT 7.1  ALBUMIN 4.3   Recent Labs  Lab 03/27/23 0823  LIPASE 30   No results for input(s): "AMMONIA" in the last 168 hours. Coagulation profile No results for input(s): "INR", "PROTIME" in the last 168 hours. COVID-19 Labs  No results for input(s): "DDIMER", "FERRITIN", "LDH", "CRP" in the last 72 hours.  Lab  Results  Component Value Date   SARSCOV2NAA NEGATIVE 03/27/2023   SARSCOV2NAA NEGATIVE 10/19/2022    CBC: Recent Labs  Lab 03/27/23 0823 03/28/23 0444  WBC 7.6 6.7  HGB 16.4 14.1  HCT 47.1 41.8  MCV 89.7 90.9  PLT 177 154   Cardiac Enzymes: No results for input(s): "CKTOTAL", "CKMB", "CKMBINDEX", "TROPONINI" in the last 168 hours. BNP (last 3 results) No results for input(s): "PROBNP" in the last 8760 hours. CBG: No results for input(s): "GLUCAP" in the last 168 hours. D-Dimer: No results for input(s): "DDIMER" in the last 72 hours. Hgb A1c: No results for input(s): "HGBA1C" in the last  72 hours. Lipid Profile: No results for input(s): "CHOL", "HDL", "LDLCALC", "TRIG", "CHOLHDL", "LDLDIRECT" in the last 72 hours. Thyroid function studies: No results for input(s): "TSH", "T4TOTAL", "T3FREE", "THYROIDAB" in the last 72 hours.  Invalid input(s): "FREET3" Anemia work up: No results for input(s): "VITAMINB12", "FOLATE", "FERRITIN", "TIBC", "IRON", "RETICCTPCT" in the last 72 hours. Sepsis Labs: Recent Labs  Lab 03/27/23 0823 03/28/23 0444  WBC 7.6 6.7   Microbiology Recent Results (from the past 240 hours)  Resp panel by RT-PCR (RSV, Flu A&B, Covid) Anterior Nasal Swab     Status: None   Collection Time: 03/27/23  8:23 AM   Specimen: Anterior Nasal Swab  Result Value Ref Range Status   SARS Coronavirus 2 by RT PCR NEGATIVE NEGATIVE Final    Comment: (NOTE) SARS-CoV-2 target nucleic acids are NOT DETECTED.  The SARS-CoV-2 RNA is generally detectable in upper respiratory specimens during the acute phase of infection. The lowest concentration of SARS-CoV-2 viral copies this assay can detect is 138 copies/mL. A negative result does not preclude SARS-Cov-2 infection and should not be used as the sole basis for treatment or other patient management decisions. A negative result may occur with  improper specimen collection/handling, submission of specimen other than nasopharyngeal swab, presence of viral mutation(s) within the areas targeted by this assay, and inadequate number of viral copies(<138 copies/mL). A negative result must be combined with clinical observations, patient history, and epidemiological information. The expected result is Negative.  Fact Sheet for Patients:  BloggerCourse.com  Fact Sheet for Healthcare Providers:  SeriousBroker.it  This test is no t yet approved or cleared by the [place] FDA and  has been authorized for detection and/or diagnosis of SARS-CoV-2 by FDA under an Emergency Use  Authorization (EUA). This EUA will remain  in effect (meaning this test can be used) for the duration of the COVID-19 declaration under Section 564(b)(1) of the Act, 21 U.S.C.section 360bbb-3(b)(1), unless the authorization is terminated  or revoked sooner.       Influenza A by PCR NEGATIVE NEGATIVE Final   Influenza B by PCR NEGATIVE NEGATIVE Final    Comment: (NOTE) The Xpert Xpress SARS-CoV-2/FLU/RSV plus assay is intended as an aid in the diagnosis of influenza from Nasopharyngeal swab specimens and should not be used as a sole basis for treatment. Nasal washings and aspirates are unacceptable for Xpert Xpress SARS-CoV-2/FLU/RSV testing.  Fact Sheet for Patients: BloggerCourse.com  Fact Sheet for Healthcare Providers: SeriousBroker.it  This test is not yet approved or cleared by the [place] FDA and has been authorized for detection and/or diagnosis of SARS-CoV-2 by FDA under an Emergency Use Authorization (EUA). This EUA will remain in effect (meaning this test can be used) for the duration of the COVID-19 declaration under Section 564(b)(1) of the Act, 21 U.S.C. section 360bbb-3(b)(1), unless the authorization is terminated or revoked.  Resp Syncytial Virus by PCR NEGATIVE NEGATIVE Final    Comment: (NOTE) Fact Sheet for Patients: BloggerCourse.com  Fact Sheet for Healthcare Providers: SeriousBroker.it  This test is not yet approved or cleared by the [place] FDA and has been authorized for detection and/or diagnosis of SARS-CoV-2 by FDA under an Emergency Use Authorization (EUA). This EUA will remain in effect (meaning this test can be used) for the duration of the COVID-19 declaration under Section 564(b)(1) of the Act, 21 U.S.C. section 360bbb-3(b)(1), unless the authorization is terminated or revoked.  Performed at [facility],  [address], [place], [place] [address]      Medications:    bisacodyl  10 mg Rectal Daily   enoxaparin (LOVENOX) injection  40 mg Subcutaneous Q24H   pantoprazole (PROTONIX) IV  40 mg Intravenous Daily   Continuous Infusions:  dextrose 5 % and 0.45 % NaCl with KCl 40 mEq/L 100 mL/hr at 03/27/23 1535   lactated ringers     potassium chloride        LOS: 1 day   Marinda Elk  [facility]  03/28/2023, 8:28 AM

## 2023-03-29 DIAGNOSIS — K56609 Unspecified intestinal obstruction, unspecified as to partial versus complete obstruction: Secondary | ICD-10-CM | POA: Diagnosis not present

## 2023-03-29 LAB — BASIC METABOLIC PANEL
Anion gap: 10 (ref 5–15)
BUN: 12 mg/dL (ref 6–20)
CO2: 25 mmol/L (ref 22–32)
Calcium: 8.7 mg/dL — ABNORMAL LOW (ref 8.9–10.3)
Chloride: 105 mmol/L (ref 98–111)
Creatinine, Ser: 0.81 mg/dL (ref 0.61–1.24)
GFR, Estimated: >60 mL/min (ref >60)
Glucose, Bld: 98 mg/dL (ref 70–99)
Potassium: 3.5 mmol/L (ref 3.5–5.1)
Sodium: 140 mmol/L (ref 135–145)

## 2023-03-29 MED ORDER — POTASSIUM CHLORIDE 20 MEQ PO PACK
40.0000 meq | PACK | Freq: Once | ORAL | Status: AC
  Administered 2023-03-29: 40 meq via ORAL
  Filled 2023-03-29: qty 2

## 2023-03-29 NOTE — Progress Notes (Signed)
 [facility] PROGRESS NOTE    Progress Note  Greg Velez  FMW:[id number] DOB: 03-Nov-1975 DOA: 03/27/2023 PCP: Arlyss Eugene BRAVO, NP     Brief Narrative:   Greg Velez is an 48 y.o. male past medical history of obesity, with a history of cholecystectomy essential hypertension COPD on room air sleep apnea admitted for small bowel obstruction.  CT scan of the abdomen pelvis shows small bowel obstruction, placed n.p.o. NG tube was placed General Surgery was consulted.  Assessment/Plan:   SBO (small bowel obstruction) (HCC): General surgery was consulted, NG tube has been clamped he is passing flatus and bowel movements. Started on IV fluids try to potassium greater than 4 magnesium  greater than 2.   Continue replete potassium. On x-ray contrast is in the colon. Continue strict I's and O's and daily weights. Ambulate patient.  Hypertensive heart disease Hold oral meds. Bp is is controlled. Hydralazine  IV prn.  Chronic obstructive pulmonary disease (HCC) Stable,  sating > 97%.  Obesity, Class I, BMI 30-34.9: No.     DVT prophylaxis: lovenox  Family Communication:none Status is: Inpatient Remains inpatient appropriate because: SBO    Code Status:     Code Status Orders  (From admission, onward)           Start     Ordered   03/27/23 1315  Full code  Continuous       Question:  By:  Answer:  Consent: discussion documented in EHR   03/27/23 1315           Code Status History     This patient has a current code status but no historical code status.         IV Access:   Peripheral IV   Procedures and diagnostic studies:   DG Abd Portable 1V Result Date: 03/28/2023 CLINICAL DATA:  Small bowel obstruction. EXAM: PORTABLE ABDOMEN - 1 VIEW COMPARISON:  Radiograph dated 03/27/2023. FINDINGS: Enteric tube with tip in the distal stomach. Oral contrast noted throughout the colon and within the rectum. Air distended loops of small bowel in the upper  abdomen slightly improved since the prior radiograph. No free air. No acute osseous pathology. IMPRESSION: 1. Enteric tube with tip in the distal stomach. 2. Oral contrast has traversed into the colon. Electronically Signed   By: Vanetta Chou M.D.   On: 03/28/2023 13:13   DG Abd Portable 1V-Small Bowel Obstruction Protocol-initial, 8 hr delay Result Date: 03/27/2023 CLINICAL DATA:  Small-bowel obstruction 8 hour delay EXAM: PORTABLE ABDOMEN - 1 VIEW COMPARISON:  CT 03/27/2023 FINDINGS: Multiple loops of dilated small bowel, some containing contrast others containing air, this measures up to 4.8 cm. Contrast in the bladder. Contrast in nondistended small bowel in the right lower quadrant. Possible dilute contrast within the left colon. No rectal contrast. IMPRESSION: Contrast present within dilated and nondilated small bowel. Possible dilute contrast present within the left colon Electronically Signed   By: Luke Bun M.D.   On: 03/27/2023 20:47   DG Abd Portable 1V-Small Bowel Protocol-Position Verification Result Date: 03/27/2023 CLINICAL DATA:  [id number] Encounter for imaging study to confirm nasogastric (NG) tube placement [id number] EXAM: PORTABLE ABDOMEN - 1 VIEW COMPARISON:  Same day CT FINDINGS: Limited radiograph of the lower chest and upper abdomen was obtained for the purposes of enteric tube localization. Enteric tube is seen coursing below the diaphragm with distal tip and side port coiled within the gastric fundus. IMPRESSION: Enteric tube coiled within the gastric fundus. Electronically Signed   By:  Nicholas  Plundo D.O.   On: 03/27/2023 12:42   CT ABDOMEN PELVIS W CONTRAST Result Date: 03/27/2023 CLINICAL DATA:  Epigastric pain, vomiting EXAM: CT ABDOMEN AND PELVIS WITH CONTRAST TECHNIQUE: Multidetector CT imaging of the abdomen and pelvis was performed using the standard protocol following bolus administration of intravenous contrast. RADIATION DOSE REDUCTION: This exam was performed according  to the departmental dose-optimization program which includes automated exposure control, adjustment of the mA and/or kV according to patient size and/or use of iterative reconstruction technique. CONTRAST:  OMNIPAQUE  IOHEXOL  300 MG/ML  SOLN COMPARISON:  12/22/2022 FINDINGS: Lower chest: No acute abnormality. Hepatobiliary: No focal liver abnormality is seen. Status post cholecystectomy. No biliary dilatation. Pancreas: Unremarkable. No pancreatic ductal dilatation or surrounding inflammatory changes. Spleen: Normal in size without focal abnormality. Adrenals/Urinary Tract: Unremarkable adrenal glands. Kidneys enhance symmetrically. There are a few tiny punctate nonobstructing stones within each kidney. No hydronephrosis. No ureteral calculi. Urinary bladder is decompressed. Stomach/Bowel: Stomach is moderately distended but appears otherwise unremarkable. Multiple dilated loops of small bowel measuring up to 4.8 cm in diameter. Multiple air-fluid levels. There is some fecalization of small bowel content. Gradual tapering to decompressed bowel in the mid right abdomen (series 2, images 35-38). No abrupt transition point. There is some liquid stool within the colon. Normal appendix in the right lower quadrant (series 2, image 68). Vascular/Lymphatic: No significant vascular findings are present. No enlarged abdominal or pelvic lymph nodes. Reproductive: Prostate is unremarkable. Other: Small volume free fluid within the pelvis. No pneumoperitoneum. No abdominal wall hernia. Musculoskeletal: No acute or significant osseous findings. IMPRESSION: 1. Multiple dilated loops of small bowel with gradual tapering to decompressed bowel in the mid right abdomen. No abrupt transition point. Findings are favored to represent a partial small bowel obstruction. 2. Small volume free fluid within the pelvis, likely reactive. 3. Bilateral nonobstructing nephrolithiasis. Electronically Signed   By: Mabel Converse D.O.   On:  03/27/2023 10:27     Medical Consultants:   None.   Subjective:    Greg Velez related he is passing gas having bowel movements.  Objective:    Vitals:   03/28/23 0533 03/28/23 1307 03/28/23 2226 03/29/23 0648  BP: 114/86 127/76 (!) 143/96 (!) 152/100  Pulse: 60 63 66 71  Resp: 16  18 16   Temp: 98.9 F (37.2 C) 97.9 F (36.6 C) 98.8 F (37.1 C) 99.5 F (37.5 C)  TempSrc: Oral Oral Oral Oral  SpO2: 97% 98% 96% 97%  Weight:      Height:       SpO2: 97 %   Intake/Output Summary (Last 24 hours) at 03/29/2023 0827 Last data filed at 03/29/2023 0658 Gross per 24 hour  Intake 2562.24 ml  Output --  Net 2562.24 ml   Filed Weights   03/27/23 0754  Weight: 112.5 kg    Exam: General exam: In no acute distress. Respiratory system: Good air movement and clear to auscultation. Cardiovascular system: S1 & S2 heard, RRR. No JVD. Gastrointestinal system: Abdomen is nondistended, soft and nontender.  Extremities: No pedal edema. Skin: No rashes, lesions or ulcers Psychiatry: Judgement and insight appear normal. Mood & affect appropriate.  Data Reviewed:    Labs: Basic Metabolic Panel: Recent Labs  Lab 03/27/23 0823 03/28/23 0444 03/29/23 0503  NA 139 140 140  K 3.6 3.3* 3.5  CL 103 107 105  CO2 25 24 25   GLUCOSE 111* 110* 98  BUN 17 18 12   CREATININE 0.73 0.82 0.81  CALCIUM   9.4 8.5* 8.7*  MG  --  1.7  --    GFR Estimated Creatinine Clearance: 145.5 mL/min (by C-G formula based on SCr of 0.81 mg/dL). Liver Function Tests: Recent Labs  Lab 03/27/23 0823  AST 31  ALT 19  ALKPHOS 69  BILITOT 1.5*  PROT 7.1  ALBUMIN 4.3   Recent Labs  Lab 03/27/23 0823  LIPASE 30   No results for input(s): AMMONIA in the last 168 hours. Coagulation profile No results for input(s): INR, PROTIME in the last 168 hours. COVID-19 Labs  No results for input(s): DDIMER, FERRITIN, LDH, CRP in the last 72 hours.  Lab Results  Component Value Date    SARSCOV2NAA NEGATIVE 03/27/2023   SARSCOV2NAA NEGATIVE 10/19/2022    CBC: Recent Labs  Lab 03/27/23 0823 03/28/23 0444  WBC 7.6 6.7  HGB 16.4 14.1  HCT 47.1 41.8  MCV 89.7 90.9  PLT 177 154   Cardiac Enzymes: No results for input(s): CKTOTAL, CKMB, CKMBINDEX, TROPONINI in the last 168 hours. BNP (last 3 results) No results for input(s): PROBNP in the last 8760 hours. CBG: No results for input(s): GLUCAP in the last 168 hours. D-Dimer: No results for input(s): DDIMER in the last 72 hours. Hgb A1c: No results for input(s): HGBA1C in the last 72 hours. Lipid Profile: No results for input(s): CHOL, HDL, LDLCALC, TRIG, CHOLHDL, LDLDIRECT in the last 72 hours. Thyroid function studies: No results for input(s): TSH, T4TOTAL, T3FREE, THYROIDAB in the last 72 hours.  Invalid input(s): FREET3 Anemia work up: No results for input(s): VITAMINB12, FOLATE, FERRITIN, TIBC, IRON, RETICCTPCT in the last 72 hours. Sepsis Labs: Recent Labs  Lab 03/27/23 0823 03/28/23 0444  WBC 7.6 6.7   Microbiology Recent Results (from the past 240 hours)  Resp panel by RT-PCR (RSV, Flu A&B, Covid) Anterior Nasal Swab     Status: None   Collection Time: 03/27/23  8:23 AM   Specimen: Anterior Nasal Swab  Result Value Ref Range Status   SARS Coronavirus 2 by RT PCR NEGATIVE NEGATIVE Final    Comment: (NOTE) SARS-CoV-2 target nucleic acids are NOT DETECTED.  The SARS-CoV-2 RNA is generally detectable in upper respiratory specimens during the acute phase of infection. The lowest concentration of SARS-CoV-2 viral copies this assay can detect is 138 copies/mL. A negative result does not preclude SARS-Cov-2 infection and should not be used as the sole basis for treatment or other patient management decisions. A negative result may occur with  improper specimen collection/handling, submission of specimen other than nasopharyngeal swab, presence of viral  mutation(s) within the areas targeted by this assay, and inadequate number of viral copies(<138 copies/mL). A negative result must be combined with clinical observations, patient history, and epidemiological information. The expected result is Negative.  Fact Sheet for Patients:  bloggercourse.com  Fact Sheet for Healthcare Providers:  seriousbroker.it  This test is no t yet approved or cleared by the [place]  FDA and  has been authorized for detection and/or diagnosis of SARS-CoV-2 by FDA under an Emergency Use Authorization (EUA). This EUA will remain  in effect (meaning this test can be used) for the duration of the COVID-19 declaration under Section 564(b)(1) of the Act, 21 U.S.C.section 360bbb-3(b)(1), unless the authorization is terminated  or revoked sooner.       Influenza A by PCR NEGATIVE NEGATIVE Final   Influenza B by PCR NEGATIVE NEGATIVE Final    Comment: (NOTE) The Xpert Xpress SARS-CoV-2/FLU/RSV plus assay is intended as an aid in the  diagnosis of influenza from Nasopharyngeal swab specimens and should not be used as a sole basis for treatment. Nasal washings and aspirates are unacceptable for Xpert Xpress SARS-CoV-2/FLU/RSV testing.  Fact Sheet for Patients: bloggercourse.com  Fact Sheet for Healthcare Providers: seriousbroker.it  This test is not yet approved or cleared by the [place]  FDA and has been authorized for detection and/or diagnosis of SARS-CoV-2 by FDA under an Emergency Use Authorization (EUA). This EUA will remain in effect (meaning this test can be used) for the duration of the COVID-19 declaration under Section 564(b)(1) of the Act, 21 U.S.C. section 360bbb-3(b)(1), unless the authorization is terminated or revoked.     Resp Syncytial Virus by PCR NEGATIVE NEGATIVE Final    Comment: (NOTE) Fact Sheet for  Patients: bloggercourse.com  Fact Sheet for Healthcare Providers: seriousbroker.it  This test is not yet approved or cleared by the [place]  FDA and has been authorized for detection and/or diagnosis of SARS-CoV-2 by FDA under an Emergency Use Authorization (EUA). This EUA will remain in effect (meaning this test can be used) for the duration of the COVID-19 declaration under Section 564(b)(1) of the Act, 21 U.S.C. section 360bbb-3(b)(1), unless the authorization is terminated or revoked.  Performed at [facility], [address] [place], [place] [address]      Medications:    bisacodyl   10 mg Rectal Daily   enoxaparin  (LOVENOX ) injection  40 mg Subcutaneous Q24H   pantoprazole  (PROTONIX ) IV  40 mg Intravenous Daily   Continuous Infusions:  lactated ringers         LOS: 2 days   Greg Velez  [facility]  03/29/2023, 8:27 AM

## 2023-03-29 NOTE — Progress Notes (Addendum)
 Subjective: CC: Seen with sign language interpreter (in person).  Patient's girlfriend at bedside.  NG tube clamped.  Patient reports he is tolerating CLD without abdominal pain, nausea or vomiting.  BM overnight.  Objective: Vital signs in last 24 hours: Temp:  [97.9 F (36.6 C)-99.5 F (37.5 C)] 99.5 F (37.5 C) (02/04 0648) Pulse Rate:  [63-71] 71 (02/04 0648) Resp:  [16-18] 16 (02/04 0648) BP: (127-152)/(76-100) 152/100 (02/04 0648) SpO2:  [96 %-98 %] 97 % (02/04 0648) Last BM Date : 03/27/23  Intake/Output from previous day: 02/03 0701 - 02/04 0700 In: 2562.2 [P.O.:763; I.V.:1510.9; IV Piggyback:288.3] Out: -  Intake/Output this shift: No intake/output data recorded.  PE: Gen:  Alert, NAD, pleasant Abd: Soft, ND, NT, +BS. NGT clamped.   Lab Results:  Recent Labs    03/27/23 0823 03/28/23 0444  WBC 7.6 6.7  HGB 16.4 14.1  HCT 47.1 41.8  PLT 177 154   BMET Recent Labs    03/28/23 0444 03/29/23 0503  NA 140 140  K 3.3* 3.5  CL 107 105  CO2 24 25  GLUCOSE 110* 98  BUN 18 12  CREATININE 0.82 0.81  CALCIUM  8.5* 8.7*   PT/INR No results for input(s): LABPROT, INR in the last 72 hours. CMP     Component Value Date/Time   NA 140 03/29/2023 0503   NA 144 09/02/2022 1234   K 3.5 03/29/2023 0503   CL 105 03/29/2023 0503   CO2 25 03/29/2023 0503   GLUCOSE 98 03/29/2023 0503   BUN 12 03/29/2023 0503   BUN 13 09/02/2022 1234   CREATININE 0.81 03/29/2023 0503   CALCIUM  8.7 (L) 03/29/2023 0503   PROT 7.1 03/27/2023 0823   ALBUMIN 4.3 03/27/2023 0823   AST 31 03/27/2023 0823   ALT 19 03/27/2023 0823   ALKPHOS 69 03/27/2023 0823   BILITOT 1.5 (H) 03/27/2023 0823   GFRNONAA >60 03/29/2023 0503   GFRAA 104 04/04/2020 1135   Lipase     Component Value Date/Time   LIPASE 30 03/27/2023 0823    Studies/Results: DG Abd Portable 1V Result Date: 03/28/2023 CLINICAL DATA:  Small bowel obstruction. EXAM: PORTABLE ABDOMEN - 1 VIEW COMPARISON:   Radiograph dated 03/27/2023. FINDINGS: Enteric tube with tip in the distal stomach. Oral contrast noted throughout the colon and within the rectum. Air distended loops of small bowel in the upper abdomen slightly improved since the prior radiograph. No free air. No acute osseous pathology. IMPRESSION: 1. Enteric tube with tip in the distal stomach. 2. Oral contrast has traversed into the colon. Electronically Signed   By: Vanetta Chou M.D.   On: 03/28/2023 13:13   DG Abd Portable 1V-Small Bowel Obstruction Protocol-initial, 8 hr delay Result Date: 03/27/2023 CLINICAL DATA:  Small-bowel obstruction 8 hour delay EXAM: PORTABLE ABDOMEN - 1 VIEW COMPARISON:  CT 03/27/2023 FINDINGS: Multiple loops of dilated small bowel, some containing contrast others containing air, this measures up to 4.8 cm. Contrast in the bladder. Contrast in nondistended small bowel in the right lower quadrant. Possible dilute contrast within the left colon. No rectal contrast. IMPRESSION: Contrast present within dilated and nondilated small bowel. Possible dilute contrast present within the left colon Electronically Signed   By: Luke Bun M.D.   On: 03/27/2023 20:47   DG Abd Portable 1V-Small Bowel Protocol-Position Verification Result Date: 03/27/2023 CLINICAL DATA:  [id number] Encounter for imaging study to confirm nasogastric (NG) tube placement [id number] EXAM: PORTABLE ABDOMEN - 1 VIEW COMPARISON:  Same day CT FINDINGS: Limited radiograph of the lower chest and upper abdomen was obtained for the purposes of enteric tube localization. Enteric tube is seen coursing below the diaphragm with distal tip and side port coiled within the gastric fundus. IMPRESSION: Enteric tube coiled within the gastric fundus. Electronically Signed   By: Mabel Converse D.O.   On: 03/27/2023 12:42   CT ABDOMEN PELVIS W CONTRAST Result Date: 03/27/2023 CLINICAL DATA:  Epigastric pain, vomiting EXAM: CT ABDOMEN AND PELVIS WITH CONTRAST TECHNIQUE: Multidetector  CT imaging of the abdomen and pelvis was performed using the standard protocol following bolus administration of intravenous contrast. RADIATION DOSE REDUCTION: This exam was performed according to the departmental dose-optimization program which includes automated exposure control, adjustment of the mA and/or kV according to patient size and/or use of iterative reconstruction technique. CONTRAST:  OMNIPAQUE  IOHEXOL  300 MG/ML  SOLN COMPARISON:  12/22/2022 FINDINGS: Lower chest: No acute abnormality. Hepatobiliary: No focal liver abnormality is seen. Status post cholecystectomy. No biliary dilatation. Pancreas: Unremarkable. No pancreatic ductal dilatation or surrounding inflammatory changes. Spleen: Normal in size without focal abnormality. Adrenals/Urinary Tract: Unremarkable adrenal glands. Kidneys enhance symmetrically. There are a few tiny punctate nonobstructing stones within each kidney. No hydronephrosis. No ureteral calculi. Urinary bladder is decompressed. Stomach/Bowel: Stomach is moderately distended but appears otherwise unremarkable. Multiple dilated loops of small bowel measuring up to 4.8 cm in diameter. Multiple air-fluid levels. There is some fecalization of small bowel content. Gradual tapering to decompressed bowel in the mid right abdomen (series 2, images 35-38). No abrupt transition point. There is some liquid stool within the colon. Normal appendix in the right lower quadrant (series 2, image 68). Vascular/Lymphatic: No significant vascular findings are present. No enlarged abdominal or pelvic lymph nodes. Reproductive: Prostate is unremarkable. Other: Small volume free fluid within the pelvis. No pneumoperitoneum. No abdominal wall hernia. Musculoskeletal: No acute or significant osseous findings. IMPRESSION: 1. Multiple dilated loops of small bowel with gradual tapering to decompressed bowel in the mid right abdomen. No abrupt transition point. Findings are favored to represent a  partial small bowel obstruction. 2. Small volume free fluid within the pelvis, likely reactive. 3. Bilateral nonobstructing nephrolithiasis. Electronically Signed   By: Mabel Converse D.O.   On: 03/27/2023 10:27    Anti-infectives: Anti-infectives (From admission, onward)    None        Assessment/Plan Small bowel obstruction   - Radiographically and clinically resolving.  Contrast in colon on 8-hour film.  Tolerating NG tube clamping and having bowel function.  DC NG tube and advance diet as tolerated.  If tolerates diet advancement, okay for discharge from our standpoint.  FEN: D/c NGT. ADAT ID: none VTE: lovenox     LOS: 2 days    Greg Velez , [facility] 03/29/2023, 9:20 AM Please see Amion for pager number during day hours 7:00am-4:30pm

## 2023-03-30 DIAGNOSIS — H9193 Unspecified hearing loss, bilateral: Secondary | ICD-10-CM

## 2023-03-30 DIAGNOSIS — G4733 Obstructive sleep apnea (adult) (pediatric): Secondary | ICD-10-CM | POA: Diagnosis not present

## 2023-03-30 DIAGNOSIS — J449 Chronic obstructive pulmonary disease, unspecified: Secondary | ICD-10-CM | POA: Diagnosis not present

## 2023-03-30 DIAGNOSIS — K56609 Unspecified intestinal obstruction, unspecified as to partial versus complete obstruction: Secondary | ICD-10-CM | POA: Diagnosis not present

## 2023-03-30 DIAGNOSIS — K219 Gastro-esophageal reflux disease without esophagitis: Secondary | ICD-10-CM

## 2023-03-30 MED ORDER — PANTOPRAZOLE SODIUM 40 MG PO TBEC
40.0000 mg | DELAYED_RELEASE_TABLET | Freq: Every day | ORAL | Status: DC
  Filled 2023-03-30: qty 1

## 2023-03-30 MED ORDER — ACETAMINOPHEN 325 MG PO TABS
650.0000 mg | ORAL_TABLET | Freq: Once | ORAL | Status: AC
  Administered 2023-03-30: 650 mg via ORAL
  Filled 2023-03-30: qty 2

## 2023-03-30 NOTE — Discharge Instructions (Signed)
 Please take all prescribed medications exactly as instructed.  You may resume all of your previous home medications. Please advance your diet as tolerated.  Once advanced, consume a low sodium low fat diet. Please increase your physical activity as tolerated. Please maintain all outpatient follow-up appointments including follow-up with your primary care provider and your nephrologist. Please return to the emergency department if you develop worsening abdominal pain, weakness or inability to tolerate oral intake.

## 2023-03-30 NOTE — Plan of Care (Signed)
   Problem: Education: Goal: Knowledge of General Education information will improve Description Including pain rating scale, medication(s)/side effects and non-pharmacologic comfort measures Outcome: Progressing

## 2023-03-30 NOTE — Hospital Course (Signed)
 48 y.o. male past medical history of obesity, deaf-mutism, with a history of cholecystectomy essential hypertension COPD, sleep apnea presenting to [facility] emergency department with complaints of abdominal pain, back pain nausea and vomiting.  Upon evaluation in the emergency department CT imaging revealed partial small bowel obstruction.  EDP discussed case with general surgery for consultation and also discussed the case with the hospitalist group for consideration of admission to the hospital.  Patient was made n.p.o. and an NG tube was placed.  Patient was aggressively hydrated with intravenous isotonic fluids.  Patient was felt to be suffering from obstruction secondary to adhesions from prior abdominal surgery.    Patient slowly improved with conservative management.  NG tube eventually clamped and diet was advanced successfully with resolution of nausea vomiting and abdominal pain.  Patient was discharged home in improved and stable condition on 03/30/2023.

## 2023-03-30 NOTE — Progress Notes (Signed)
 Discharge instructions reviewed with patient and spouse. All questions answered. All belongings accounted for. Patient to follow up with MD in  1 weeks. Orders to resume home meds as prescribed. PIV removed.   Teaching completed with assistance of Video ASL interpreter Caiti ID [id number].   Patient able to express understanding of teaching.

## 2023-03-30 NOTE — Discharge Summary (Signed)
 Physician Discharge Summary   Patient: Greg Velez MRN: [id number] DOB: 09-27-1975  Admit date:     03/27/2023  Discharge date: 03/30/23  Discharge Physician: Zachary JINNY Ba   PCP: Arlyss Eugene BRAVO, NP   Recommendations at discharge:    Please take all prescribed medications exactly as instructed.  You may resume all of your previous home medications. Please advance your diet as tolerated.  Once advanced, consume a low sodium low fat diet. Please increase your physical activity as tolerated. Please maintain all outpatient follow-up appointments including follow-up with your primary care provider and your nephrologist. Please return to the emergency department if you develop worsening abdominal pain, weakness or inability to tolerate oral intake.  Discharge Diagnoses: Principal Problem:   SBO (small bowel obstruction) (HCC) Active Problems:   Hypertensive heart disease   Chronic obstructive pulmonary disease (HCC)   Obesity, Class I, BMI 30-34.9   GERD (gastroesophageal reflux disease)   OSA (obstructive sleep apnea)   Hearing impaired person, bilateral  Resolved Problems:   * No resolved hospital problems. *   Hospital Course: 48 y.o. male past medical history of obesity, deaf-mutism, with a history of cholecystectomy essential hypertension COPD, sleep apnea presenting to [facility] emergency department with complaints of abdominal pain, back pain nausea and vomiting.  Upon evaluation in the emergency department CT imaging revealed partial small bowel obstruction.  EDP discussed case with general surgery for consultation and also discussed the case with the hospitalist group for consideration of admission to the hospital.  Patient was made n.p.o. and an NG tube was placed.  Patient was aggressively hydrated with intravenous isotonic fluids.  Patient was felt to be suffering from obstruction secondary to adhesions from prior abdominal surgery.    Patient slowly  improved with conservative management.  NG tube eventually clamped and diet was advanced successfully with resolution of nausea vomiting and abdominal pain.  Patient was discharged home in improved and stable condition on 03/30/2023.      Consultants: Dr. Eletha with General Surgery Procedures performed: none  Disposition: Home Diet recommendation:  Regular diet  DISCHARGE MEDICATION: Allergies as of 03/30/2023   No Known Allergies      Medication List     TAKE these medications    amLODipine  2.5 MG tablet Commonly known as: NORVASC  Take 2.5 mg by mouth daily.   atorvastatin  10 MG tablet Commonly known as: LIPITOR Take 1 tablet (10 mg total) by mouth daily. What changed: when to take this   carvedilol  6.25 MG tablet Commonly known as: COREG  Take 1 tablet (6.25 mg total) by mouth 2 (two) times daily.   cyclobenzaprine  10 MG tablet Commonly known as: FLEXERIL  Take 1 tablet (10 mg total) by mouth 2 (two) times daily as needed for muscle spasms.   losartan  100 MG tablet Commonly known as: COZAAR  Take 1 tablet (100 mg total) by mouth daily.   methocarbamol  500 MG tablet Commonly known as: ROBAXIN  Take 500 mg by mouth in the morning and at bedtime.   omeprazole  40 MG capsule Commonly known as: PRILOSEC Take 1 capsule (40 mg total) by mouth daily.   ondansetron  4 MG disintegrating tablet Commonly known as: ZOFRAN -ODT Take 1 tablet (4 mg total) by mouth every 6 (six) hours as needed for nausea or vomiting.   tamsulosin  0.4 MG Caps capsule Commonly known as: FLOMAX  Take 0.4 mg by mouth daily.   Vitamin D (Ergocalciferol) 1.25 MG (50000 UNIT) Caps capsule Commonly known as: DRISDOL Take 50,000 Units  by mouth once a week. fridays        Follow-up Information     Arlyss Eugene BRAVO, NP. Schedule an appointment as soon as possible for a visit in 1 week(s).   Specialty: Family Medicine                Discharge Exam: Filed Weights   03/27/23 0754  Weight:  112.5 kg    Constitutional: Awake alert and oriented x3, no associated distress.   Respiratory: clear to auscultation bilaterally, no wheezing, no crackles. Normal respiratory effort. No accessory muscle use.  Cardiovascular: Regular rate and rhythm, no murmurs / rubs / gallops. No extremity edema. 2+ pedal pulses. No carotid bruits.  Abdomen: Abdomen is soft and nontender.  No evidence of intra-abdominal masses.  Positive bowel sounds noted in all quadrants.   Musculoskeletal: No joint deformity upper and lower extremities. Good ROM, no contractures. Normal muscle tone.     Condition at discharge: fair  The results of significant diagnostics from this hospitalization (including imaging, microbiology, ancillary and laboratory) are listed below for reference.   Imaging Studies: DG Abd Portable 1V Result Date: 03/28/2023 CLINICAL DATA:  Small bowel obstruction. EXAM: PORTABLE ABDOMEN - 1 VIEW COMPARISON:  Radiograph dated 03/27/2023. FINDINGS: Enteric tube with tip in the distal stomach. Oral contrast noted throughout the colon and within the rectum. Air distended loops of small bowel in the upper abdomen slightly improved since the prior radiograph. No free air. No acute osseous pathology. IMPRESSION: 1. Enteric tube with tip in the distal stomach. 2. Oral contrast has traversed into the colon. Electronically Signed   By: Vanetta Chou M.D.   On: 03/28/2023 13:13   DG Abd Portable 1V-Small Bowel Obstruction Protocol-initial, 8 hr delay Result Date: 03/27/2023 CLINICAL DATA:  Small-bowel obstruction 8 hour delay EXAM: PORTABLE ABDOMEN - 1 VIEW COMPARISON:  CT 03/27/2023 FINDINGS: Multiple loops of dilated small bowel, some containing contrast others containing air, this measures up to 4.8 cm. Contrast in the bladder. Contrast in nondistended small bowel in the right lower quadrant. Possible dilute contrast within the left colon. No rectal contrast. IMPRESSION: Contrast present within dilated and  nondilated small bowel. Possible dilute contrast present within the left colon Electronically Signed   By: Luke Bun M.D.   On: 03/27/2023 20:47   DG Abd Portable 1V-Small Bowel Protocol-Position Verification Result Date: 03/27/2023 CLINICAL DATA:  [id number] Encounter for imaging study to confirm nasogastric (NG) tube placement [id number] EXAM: PORTABLE ABDOMEN - 1 VIEW COMPARISON:  Same day CT FINDINGS: Limited radiograph of the lower chest and upper abdomen was obtained for the purposes of enteric tube localization. Enteric tube is seen coursing below the diaphragm with distal tip and side port coiled within the gastric fundus. IMPRESSION: Enteric tube coiled within the gastric fundus. Electronically Signed   By: Mabel Converse D.O.   On: 03/27/2023 12:42   CT ABDOMEN PELVIS W CONTRAST Result Date: 03/27/2023 CLINICAL DATA:  Epigastric pain, vomiting EXAM: CT ABDOMEN AND PELVIS WITH CONTRAST TECHNIQUE: Multidetector CT imaging of the abdomen and pelvis was performed using the standard protocol following bolus administration of intravenous contrast. RADIATION DOSE REDUCTION: This exam was performed according to the departmental dose-optimization program which includes automated exposure control, adjustment of the mA and/or kV according to patient size and/or use of iterative reconstruction technique. CONTRAST:  OMNIPAQUE  IOHEXOL  300 MG/ML  SOLN COMPARISON:  12/22/2022 FINDINGS: Lower chest: No acute abnormality. Hepatobiliary: No focal liver abnormality is seen. Status post cholecystectomy.  No biliary dilatation. Pancreas: Unremarkable. No pancreatic ductal dilatation or surrounding inflammatory changes. Spleen: Normal in size without focal abnormality. Adrenals/Urinary Tract: Unremarkable adrenal glands. Kidneys enhance symmetrically. There are a few tiny punctate nonobstructing stones within each kidney. No hydronephrosis. No ureteral calculi. Urinary bladder is decompressed. Stomach/Bowel: Stomach is  moderately distended but appears otherwise unremarkable. Multiple dilated loops of small bowel measuring up to 4.8 cm in diameter. Multiple air-fluid levels. There is some fecalization of small bowel content. Gradual tapering to decompressed bowel in the mid right abdomen (series 2, images 35-38). No abrupt transition point. There is some liquid stool within the colon. Normal appendix in the right lower quadrant (series 2, image 68). Vascular/Lymphatic: No significant vascular findings are present. No enlarged abdominal or pelvic lymph nodes. Reproductive: Prostate is unremarkable. Other: Small volume free fluid within the pelvis. No pneumoperitoneum. No abdominal wall hernia. Musculoskeletal: No acute or significant osseous findings. IMPRESSION: 1. Multiple dilated loops of small bowel with gradual tapering to decompressed bowel in the mid right abdomen. No abrupt transition point. Findings are favored to represent a partial small bowel obstruction. 2. Small volume free fluid within the pelvis, likely reactive. 3. Bilateral nonobstructing nephrolithiasis. Electronically Signed   By: Mabel Converse D.O.   On: 03/27/2023 10:27    Microbiology: Results for orders placed or performed during the hospital encounter of 03/27/23  Resp panel by RT-PCR (RSV, Flu A&B, Covid) Anterior Nasal Swab     Status: None   Collection Time: 03/27/23  8:23 AM   Specimen: Anterior Nasal Swab  Result Value Ref Range Status   SARS Coronavirus 2 by RT PCR NEGATIVE NEGATIVE Final    Comment: (NOTE) SARS-CoV-2 target nucleic acids are NOT DETECTED.  The SARS-CoV-2 RNA is generally detectable in upper respiratory specimens during the acute phase of infection. The lowest concentration of SARS-CoV-2 viral copies this assay can detect is 138 copies/mL. A negative result does not preclude SARS-Cov-2 infection and should not be used as the sole basis for treatment or other patient management decisions. A negative result may occur  with  improper specimen collection/handling, submission of specimen other than nasopharyngeal swab, presence of viral mutation(s) within the areas targeted by this assay, and inadequate number of viral copies(<138 copies/mL). A negative result must be combined with clinical observations, patient history, and epidemiological information. The expected result is Negative.  Fact Sheet for Patients:  bloggercourse.com  Fact Sheet for Healthcare Providers:  seriousbroker.it  This test is no t yet approved or cleared by the [place]  FDA and  has been authorized for detection and/or diagnosis of SARS-CoV-2 by FDA under an Emergency Use Authorization (EUA). This EUA will remain  in effect (meaning this test can be used) for the duration of the COVID-19 declaration under Section 564(b)(1) of the Act, 21 U.S.C.section 360bbb-3(b)(1), unless the authorization is terminated  or revoked sooner.       Influenza A by PCR NEGATIVE NEGATIVE Final   Influenza B by PCR NEGATIVE NEGATIVE Final    Comment: (NOTE) The Xpert Xpress SARS-CoV-2/FLU/RSV plus assay is intended as an aid in the diagnosis of influenza from Nasopharyngeal swab specimens and should not be used as a sole basis for treatment. Nasal washings and aspirates are unacceptable for Xpert Xpress SARS-CoV-2/FLU/RSV testing.  Fact Sheet for Patients: bloggercourse.com  Fact Sheet for Healthcare Providers: seriousbroker.it  This test is not yet approved or cleared by the [place]  FDA and has been authorized for detection and/or diagnosis of SARS-CoV-2 by  FDA under an Emergency Use Authorization (EUA). This EUA will remain in effect (meaning this test can be used) for the duration of the COVID-19 declaration under Section 564(b)(1) of the Act, 21 U.S.C. section 360bbb-3(b)(1), unless the authorization is terminated  or revoked.     Resp Syncytial Virus by PCR NEGATIVE NEGATIVE Final    Comment: (NOTE) Fact Sheet for Patients: bloggercourse.com  Fact Sheet for Healthcare Providers: seriousbroker.it  This test is not yet approved or cleared by the [place]  FDA and has been authorized for detection and/or diagnosis of SARS-CoV-2 by FDA under an Emergency Use Authorization (EUA). This EUA will remain in effect (meaning this test can be used) for the duration of the COVID-19 declaration under Section 564(b)(1) of the Act, 21 U.S.C. section 360bbb-3(b)(1), unless the authorization is terminated or revoked.  Performed at [facility], [address], [place], [place] [address]     Labs: CBC: Recent Labs  Lab 03/27/23 [id number] 03/28/23 0444  WBC 7.6 6.7  HGB 16.4 14.1  HCT 47.1 41.8  MCV 89.7 90.9  PLT 177 154   Basic Metabolic Panel: Recent Labs  Lab 03/27/23 0823 03/28/23 0444 03/29/23 0503  NA 139 140 140  K 3.6 3.3* 3.5  CL 103 107 105  CO2 25 24 25   GLUCOSE 111* 110* 98  BUN 17 18 12   CREATININE 0.73 0.82 0.81  CALCIUM  9.4 8.5* 8.7*  MG  --  1.7  --    Liver Function Tests: Recent Labs  Lab 03/27/23 0823  AST 31  ALT 19  ALKPHOS 69  BILITOT 1.5*  PROT 7.1  ALBUMIN 4.3   CBG: No results for input(s): GLUCAP in the last 168 hours.  Discharge time spent: greater than 30 minutes.  Signed: Zachary JINNY Ba, MD [facility] 03/30/2023

## 2023-03-30 NOTE — Progress Notes (Signed)
       Subjective: CC: Seen with sign language interpreter ID #[id number]  NGT out. Tolerating soft diet without n/v/abd pain. Having bowel movements  Significant other at bedside  Objective: Vital signs in last 24 hours: Temp:  [98.7 F (37.1 C)-99.6 F (37.6 C)] 98.7 F (37.1 C) (02/05 0532) Pulse Rate:  [73-77] 76 (02/05 0532) Resp:  [17-18] 18 (02/05 0532) BP: (130-156)/(77-100) 156/100 (02/05 0532) SpO2:  [97 %-98 %] 97 % (02/05 0532) Last BM Date : 03/28/23  Intake/Output from previous day: 02/04 0701 - 02/05 0700 In: 1140 [P.O.:1140] Out: 0  Intake/Output this shift: No intake/output data recorded.  PE: Gen:  Alert, NAD, pleasant Abd: Soft, ND, NT  Lab Results:  Recent Labs    03/28/23 0444  WBC 6.7  HGB 14.1  HCT 41.8  PLT 154   BMET Recent Labs    03/28/23 0444 03/29/23 0503  NA 140 140  K 3.3* 3.5  CL 107 105  CO2 24 25  GLUCOSE 110* 98  BUN 18 12  CREATININE 0.82 0.81  CALCIUM  8.5* 8.7*   PT/INR No results for input(s): LABPROT, INR in the last 72 hours. CMP     Component Value Date/Time   NA 140 03/29/2023 0503   NA 144 09/02/2022 1234   K 3.5 03/29/2023 0503   CL 105 03/29/2023 0503   CO2 25 03/29/2023 0503   GLUCOSE 98 03/29/2023 0503   BUN 12 03/29/2023 0503   BUN 13 09/02/2022 1234   CREATININE 0.81 03/29/2023 0503   CALCIUM  8.7 (L) 03/29/2023 0503   PROT 7.1 03/27/2023 0823   ALBUMIN 4.3 03/27/2023 0823   AST 31 03/27/2023 0823   ALT 19 03/27/2023 0823   ALKPHOS 69 03/27/2023 0823   BILITOT 1.5 (H) 03/27/2023 0823   GFRNONAA >60 03/29/2023 0503   GFRAA 104 04/04/2020 1135   Lipase     Component Value Date/Time   LIPASE 30 03/27/2023 0823    Studies/Results: No results found.   Anti-infectives: Anti-infectives (From admission, onward)    None        Assessment/Plan Small bowel obstruction   - Radiographically and clinically resolved.  Contrast in colon on 8-hour film.   - tolerating soft diet.  Stable for dc from surgical standpoint  FEN: soft ID: none VTE: lovenox     LOS: 3 days    [place] VEAR Mais , [facility] 03/30/2023, 10:19 AM Please see Amion for pager number during day hours 7:00am-4:30pm

## 2024-02-06 ENCOUNTER — Other Ambulatory Visit: Payer: Self-pay

## 2024-02-06 ENCOUNTER — Emergency Department (HOSPITAL_COMMUNITY): Admission: EM | Admit: 2024-02-06 | Discharge: 2024-02-06 | Disposition: A | Discharge status: Home / Self Care

## 2024-02-06 ENCOUNTER — Emergency Department (HOSPITAL_COMMUNITY)

## 2024-02-06 ENCOUNTER — Encounter (HOSPITAL_COMMUNITY): Payer: Self-pay

## 2024-02-06 DIAGNOSIS — M25561 Pain in right knee: Secondary | ICD-10-CM

## 2024-02-06 DIAGNOSIS — W19XXXA Unspecified fall, initial encounter: Secondary | ICD-10-CM

## 2024-02-06 DIAGNOSIS — W01198A Fall on same level from slipping, tripping and stumbling with subsequent striking against other object, initial encounter: Secondary | ICD-10-CM | POA: Diagnosis not present

## 2024-02-06 MED ORDER — KETOROLAC TROMETHAMINE 15 MG/ML IJ SOLN
15.0000 mg | Freq: Once | INTRAMUSCULAR | Status: AC
  Administered 2024-02-06: 23:00:00 15 mg via INTRAMUSCULAR
  Filled 2024-02-06: qty 1

## 2024-02-06 NOTE — ED Triage Notes (Signed)
 BIB EMS from work for mechanical fall. C/o right knee and lower leg pain. No head injury. A&Ox4 per EMS. Swelling noted to right knee all the way down leg. Pt is unable to bear weight

## 2024-02-06 NOTE — Discharge Instructions (Addendum)
 You can alternate tylenol  and motrin  every 3 hours as needed for pain. You can use ice and your ace wrap as needed.  Follow-up with your primary care doctor in 1 to 2 weeks if your symptoms do not improve

## 2024-02-06 NOTE — ED Provider Notes (Signed)
 [place] EMERGENCY DEPARTMENT AT [facility] Provider Note   CSN: [id number] Arrival date & time: 02/06/24  1655     Patient presents with: Greg Velez is a 48 y.o. male.   48 year old male presents for evaluation of right knee pain after a fall.  States he was at work today, tripped over a box and fell hitting his right knee on the cement floor.  States he waited around, but pain got worse throughout the day and started shooting down his leg and up his back so he came to the ER.  Denies hitting his head or losing consciousness.  He is not on any blood thinners.   Fall Pertinent negatives include no chest pain, no abdominal pain and no shortness of breath.       Prior to Admission medications  Medication Sig Start Date End Date Taking? Authorizing Provider  amLODipine  (NORVASC ) 2.5 MG tablet Take 2.5 mg by mouth daily.    [provider]  atorvastatin  (LIPITOR) 10 MG tablet Take 1 tablet (10 mg total) by mouth daily. Patient taking differently: Take 10 mg by mouth at bedtime. 07/04/20   Hobart Powell BRAVO, MD  carvedilol  (COREG ) 6.25 MG tablet Take 1 tablet (6.25 mg total) by mouth 2 (two) times daily. Patient taking differently: Take 6.25 mg by mouth 2 (two) times daily. 07/04/20   Hobart Powell BRAVO, MD  cyclobenzaprine  (FLEXERIL ) 10 MG tablet Take 1 tablet (10 mg total) by mouth 2 (two) times daily as needed for muscle spasms. 03/24/23   Rising, Asberry, PA-C  losartan  (COZAAR ) 100 MG tablet Take 1 tablet (100 mg total) by mouth daily. Patient taking differently: Take 1 tablet by mouth daily. 07/04/20   Hobart Powell BRAVO, MD  methocarbamol  (ROBAXIN ) 500 MG tablet Take 500 mg by mouth in the morning and at bedtime.    [provider]  omeprazole  (PRILOSEC) 40 MG capsule Take 1 capsule (40 mg total) by mouth daily. Patient taking differently: Take 40 mg by mouth daily. 05/09/20   Hobart Powell BRAVO, MD  ondansetron  (ZOFRAN -ODT) 4 MG  disintegrating tablet Take 1 tablet (4 mg total) by mouth every 6 (six) hours as needed for nausea or vomiting. 03/24/23   Rising, Asberry, PA-C  tamsulosin  (FLOMAX ) 0.4 MG CAPS capsule Take 0.4 mg by mouth daily.    [provider]  Vitamin D, Ergocalciferol, (DRISDOL) 1.25 MG (50000 UNIT) CAPS capsule Take 50,000 Units by mouth once a week. fridays    [provider]    Allergies: Patient Velez no known allergies.    Review of Systems  Constitutional:  Negative for chills and fever.  HENT:  Negative for ear pain and sore throat.   Eyes:  Negative for pain and visual disturbance.  Respiratory:  Negative for cough and shortness of breath.   Cardiovascular:  Negative for chest pain and palpitations.  Gastrointestinal:  Negative for abdominal pain and vomiting.  Genitourinary:  Negative for dysuria and hematuria.  Musculoskeletal:  Negative for arthralgias and back pain.       Admits right knee pain   Skin:  Negative for color change and rash.  Neurological:  Negative for seizures and syncope.  All other systems reviewed and are negative.   Updated Vital Signs BP 138/89 (BP Location: Right Arm)   Pulse (!) 51   Temp 97.8 F (36.6 C) (Oral)   Resp 19   Ht 6' 2 (1.88 m)   Wt 112.5 kg   SpO2  96%   BMI 31.84 kg/m   Physical Exam Vitals and nursing note reviewed.  Constitutional:      General: He is not in acute distress.    Appearance: Normal appearance. He is well-developed. He is not ill-appearing.  HENT:     Head: Normocephalic and atraumatic.  Eyes:     Conjunctiva/sclera: Conjunctivae normal.  Cardiovascular:     Rate and Rhythm: Normal rate and regular rhythm.     Heart sounds: No murmur heard. Pulmonary:     Effort: Pulmonary effort is normal. No respiratory distress.     Breath sounds: Normal breath sounds.  Abdominal:     Palpations: Abdomen is soft.     Tenderness: There is no abdominal tenderness.  Musculoskeletal:        General: No  swelling.     Cervical back: Neck supple.     Comments: Mild ttp over the medial left knee, limited ROM in flexion secondary to pain, no ecchymosis or swelling, full ROM in extension   Skin:    General: Skin is warm and dry.     Capillary Refill: Capillary refill takes less than 2 seconds.  Neurological:     Mental Status: He is alert.  Psychiatric:        Mood and Affect: Mood normal.     (all labs ordered are listed, but only abnormal results are displayed) Labs Reviewed - No data to display  EKG: None  Radiology: DG Knee Complete 4 Views Right Result Date: 02/06/2024 EXAM: 4 OR MORE VIEW(S) XRAY OF THE KNEE 02/06/2024 05:43:44 PM COMPARISON: None available. CLINICAL HISTORY: leg pain FINDINGS: BONES AND JOINTS: No acute fracture. No malalignment. No significant joint effusion. SOFT TISSUES: The soft tissues are unremarkable. IMPRESSION: 1. No acute findings. Electronically signed by: Morene Hoard MD 02/06/2024 06:36 PM EST RP Workstation: [id number]   DG Tibia/Fibula Right Result Date: 02/06/2024 EXAM: 3 VIEW(S) XRAY OF THE LATERALITY TIBIA AND FIBULA 02/06/2024 05:43:44 PM COMPARISON: None available. CLINICAL HISTORY: leg pain FINDINGS: BONES AND JOINTS: No acute fracture. No malalignment. SOFT TISSUES: The soft tissues are unremarkable. IMPRESSION: 1. No acute osseous abnormality identified. Electronically signed by: Greig Pique MD 02/06/2024 06:28 PM EST RP Workstation: [id number]     Procedures   Medications Ordered in the ED  ketorolac  (TORADOL ) 15 MG/ML injection 15 mg (Velez no administration in time range)                                    Medical Decision Making Interpreter used to speak with patient as he is deaf and uses nothing which.  Had a fall with some right knee pain.  Negative imaging.  Will give him Toradol  here and advised Tylenol  Motrin  as needed for pain.  We also given Ace wrap.  Advised close up with primary care doctor 1 to 2 weeks or if his  symptoms do not improve.  Vies to return to the ER for new or worsening symptoms.  He feels comfortable be discharged home.  Problems Addressed: Acute pain of right knee: acute illness or injury Fall, initial encounter: acute illness or injury  Amount and/or Complexity of Data Reviewed External Data Reviewed: notes.    Details: Prior hospital records reviewed and patient was admitted 6/19-25 for chest pain Radiology: ordered and independent interpretation performed. Decision-making details documented in ED Course.    Details: Ordered and interpreted by me independently of radiology  Knee and tib-fib x-ray: Showed no acute abnormality or fracture  Risk OTC drugs. Prescription drug management.     Final diagnoses:  Fall, initial encounter  Acute pain of right knee    ED Discharge Orders     None          Gennaro Duwaine CROME, DO 02/06/24 2313
